# Patient Record
Sex: Female | Born: 1970 | Race: White | Hispanic: No | Marital: Married | Smoking: Never smoker
Health system: Southern US, Community
[De-identification: ages and names within clinical notes are randomized; demographics above are authoritative.]

## PROBLEM LIST (undated history)

## (undated) DIAGNOSIS — E1140 Type 2 diabetes mellitus with diabetic neuropathy, unspecified: Secondary | ICD-10-CM

## (undated) DIAGNOSIS — R51 Headache: Secondary | ICD-10-CM

## (undated) DIAGNOSIS — E119 Type 2 diabetes mellitus without complications: Secondary | ICD-10-CM

## (undated) DIAGNOSIS — N200 Calculus of kidney: Secondary | ICD-10-CM

## (undated) DIAGNOSIS — E785 Hyperlipidemia, unspecified: Secondary | ICD-10-CM

## (undated) DIAGNOSIS — I10 Essential (primary) hypertension: Secondary | ICD-10-CM

## (undated) DIAGNOSIS — G4700 Insomnia, unspecified: Secondary | ICD-10-CM

## (undated) DIAGNOSIS — IMO0002 Reserved for concepts with insufficient information to code with codable children: Secondary | ICD-10-CM

## (undated) DIAGNOSIS — D649 Anemia, unspecified: Secondary | ICD-10-CM

## (undated) HISTORY — DX: Anemia, unspecified: D64.9

## (undated) HISTORY — DX: Calculus of kidney: N20.0

## (undated) HISTORY — DX: Essential (primary) hypertension: I10

## (undated) HISTORY — DX: Reserved for concepts with insufficient information to code with codable children: IMO0002

## (undated) HISTORY — DX: Type 2 diabetes mellitus with diabetic neuropathy, unspecified: E11.40

## (undated) HISTORY — DX: Headache: R51

## (undated) HISTORY — DX: Insomnia, unspecified: G47.00

## (undated) HISTORY — PX: LAPAROSCOPY: SHX197

## (undated) HISTORY — DX: Type 2 diabetes mellitus without complications: E11.9

## (undated) HISTORY — DX: Hyperlipidemia, unspecified: E78.5

---
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
## 1998-08-07 ENCOUNTER — Inpatient Hospital Stay (HOSPITAL_COMMUNITY): Admission: AD | Admit: 1998-08-07 | Discharge: 1998-08-07 | Payer: Self-pay | Admitting: Obstetrics and Gynecology

## 1998-08-14 ENCOUNTER — Inpatient Hospital Stay (HOSPITAL_COMMUNITY): Admission: AD | Admit: 1998-08-14 | Discharge: 1998-08-17 | Payer: Self-pay | Admitting: Obstetrics and Gynecology

## 1998-08-18 ENCOUNTER — Inpatient Hospital Stay (HOSPITAL_COMMUNITY): Admission: AD | Admit: 1998-08-18 | Discharge: 1998-08-18 | Payer: Self-pay | Admitting: Obstetrics & Gynecology

## 1998-08-23 ENCOUNTER — Encounter (HOSPITAL_COMMUNITY): Admission: RE | Admit: 1998-08-23 | Discharge: 1998-11-21 | Payer: Self-pay | Admitting: Obstetrics and Gynecology

## 1999-10-09 ENCOUNTER — Inpatient Hospital Stay (HOSPITAL_COMMUNITY): Admission: AD | Admit: 1999-10-09 | Discharge: 1999-10-12 | Payer: Self-pay | Admitting: Obstetrics and Gynecology

## 2001-01-27 ENCOUNTER — Other Ambulatory Visit: Admission: RE | Admit: 2001-01-27 | Discharge: 2001-01-27 | Payer: Self-pay | Admitting: Obstetrics and Gynecology

## 2002-02-22 ENCOUNTER — Other Ambulatory Visit: Admission: RE | Admit: 2002-02-22 | Discharge: 2002-02-22 | Payer: Self-pay | Admitting: Obstetrics and Gynecology

## 2003-03-16 ENCOUNTER — Other Ambulatory Visit: Admission: RE | Admit: 2003-03-16 | Discharge: 2003-03-16 | Payer: Self-pay | Admitting: Obstetrics and Gynecology

## 2004-03-21 ENCOUNTER — Other Ambulatory Visit: Admission: RE | Admit: 2004-03-21 | Discharge: 2004-03-21 | Payer: Self-pay | Admitting: Obstetrics and Gynecology

## 2004-07-10 HISTORY — PX: OTHER SURGICAL HISTORY: SHX169

## 2005-06-29 ENCOUNTER — Ambulatory Visit (HOSPITAL_COMMUNITY): Admission: AD | Admit: 2005-06-29 | Discharge: 2005-06-29 | Payer: Self-pay | Admitting: Obstetrics & Gynecology

## 2005-06-29 ENCOUNTER — Encounter (INDEPENDENT_AMBULATORY_CARE_PROVIDER_SITE_OTHER): Payer: Self-pay | Admitting: Specialist

## 2005-07-25 ENCOUNTER — Other Ambulatory Visit: Admission: RE | Admit: 2005-07-25 | Discharge: 2005-07-25 | Payer: Self-pay | Admitting: Obstetrics and Gynecology

## 2006-09-11 ENCOUNTER — Ambulatory Visit (HOSPITAL_COMMUNITY): Admission: RE | Admit: 2006-09-11 | Discharge: 2006-09-11 | Payer: Self-pay | Admitting: Obstetrics and Gynecology

## 2006-09-11 ENCOUNTER — Encounter (INDEPENDENT_AMBULATORY_CARE_PROVIDER_SITE_OTHER): Payer: Self-pay | Admitting: Specialist

## 2009-01-29 ENCOUNTER — Emergency Department (HOSPITAL_COMMUNITY): Admission: EM | Admit: 2009-01-29 | Discharge: 2009-01-29 | Payer: Self-pay | Admitting: Emergency Medicine

## 2009-02-01 ENCOUNTER — Ambulatory Visit: Payer: Self-pay | Admitting: Diagnostic Radiology

## 2009-02-01 ENCOUNTER — Emergency Department (HOSPITAL_BASED_OUTPATIENT_CLINIC_OR_DEPARTMENT_OTHER): Admission: EM | Admit: 2009-02-01 | Discharge: 2009-02-01 | Payer: Self-pay | Admitting: Emergency Medicine

## 2009-02-03 ENCOUNTER — Emergency Department (HOSPITAL_BASED_OUTPATIENT_CLINIC_OR_DEPARTMENT_OTHER): Admission: EM | Admit: 2009-02-03 | Discharge: 2009-02-03 | Payer: Self-pay | Admitting: Emergency Medicine

## 2009-02-03 ENCOUNTER — Ambulatory Visit: Payer: Self-pay | Admitting: Interventional Radiology

## 2010-07-05 IMAGING — CR DG CHEST 2V
2 series · 2 of 2 positions shown · non-contrast
Comparison: None

CLINICAL DATA: Cough.  Chest pain.

CHEST - 2 VIEW

[w chest pa]
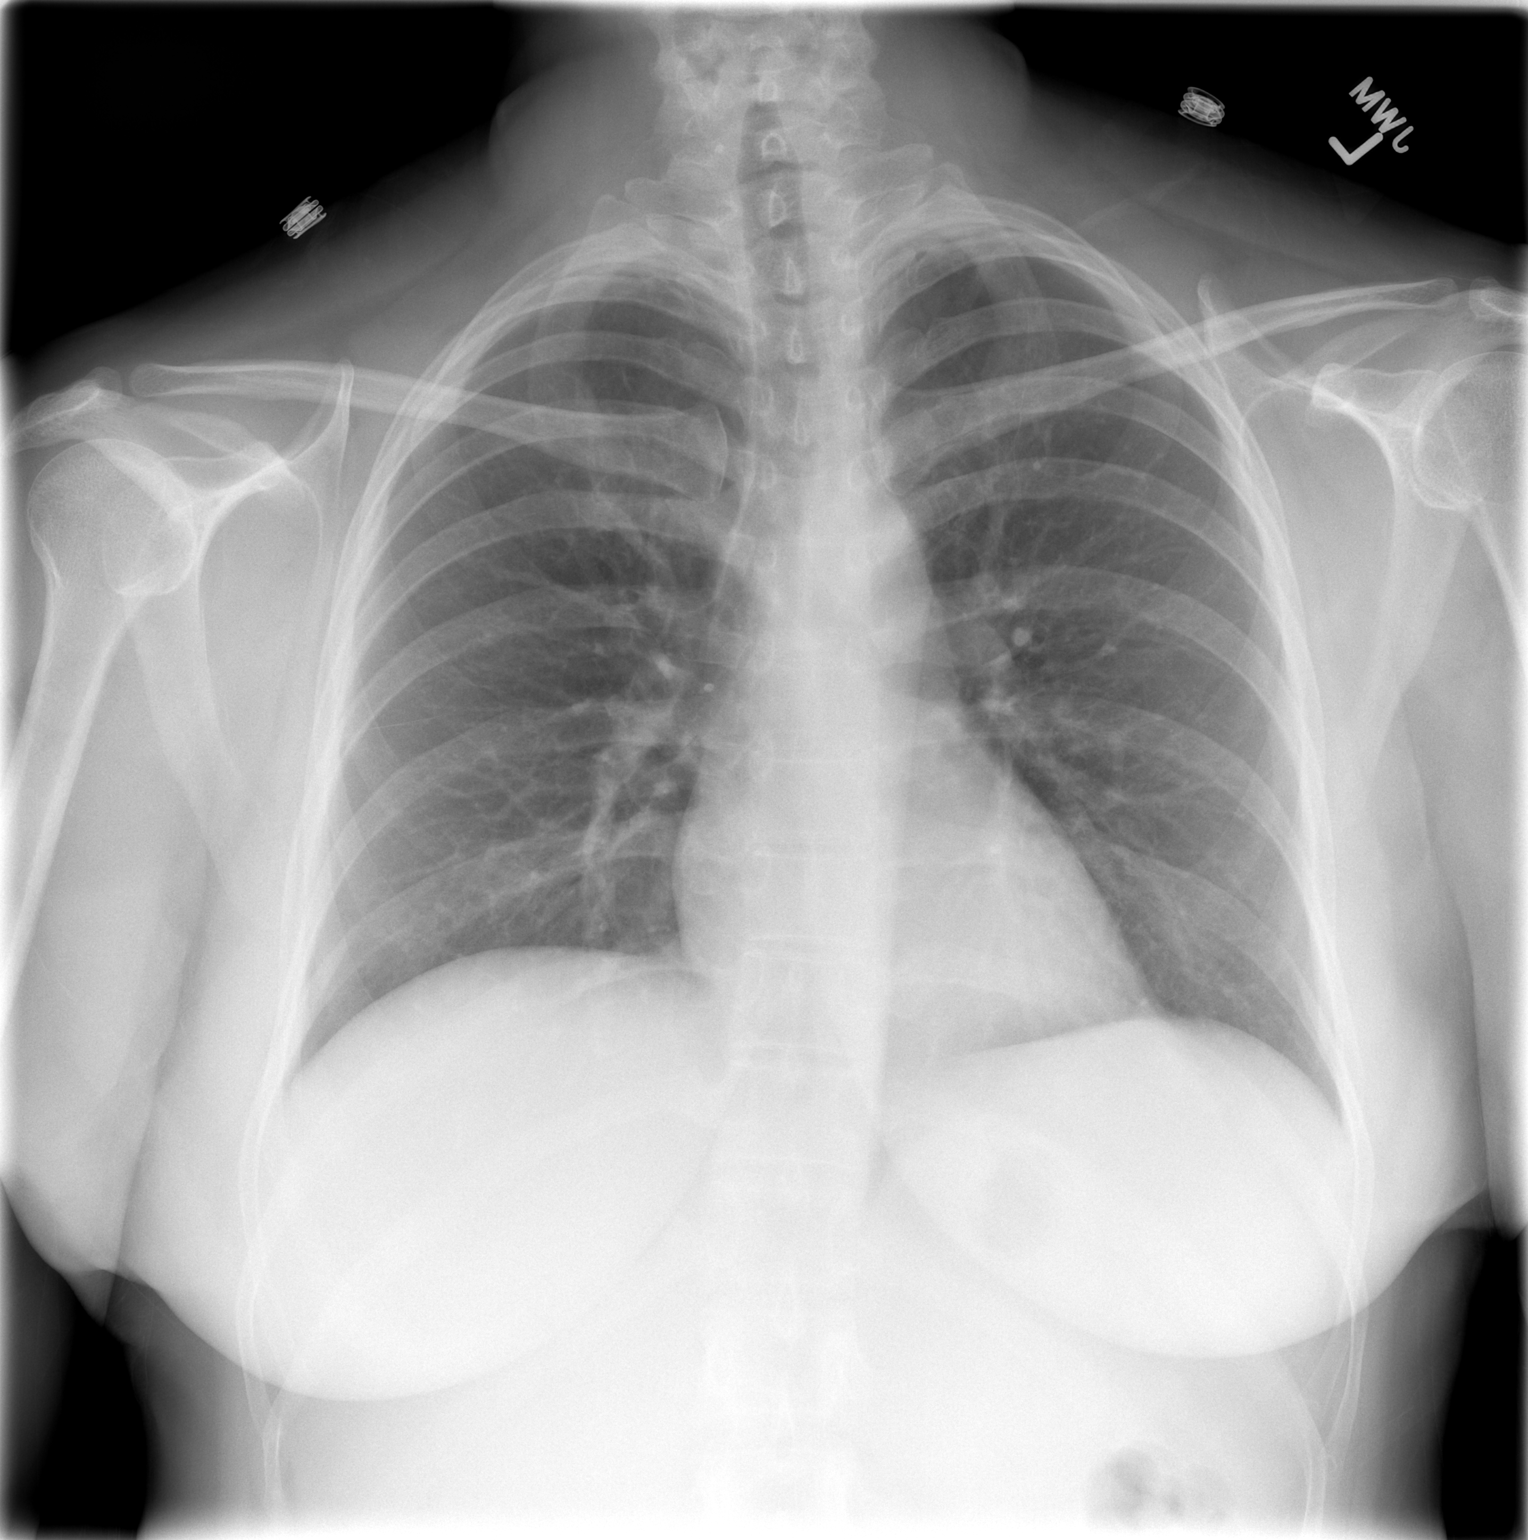

[w chest lat]
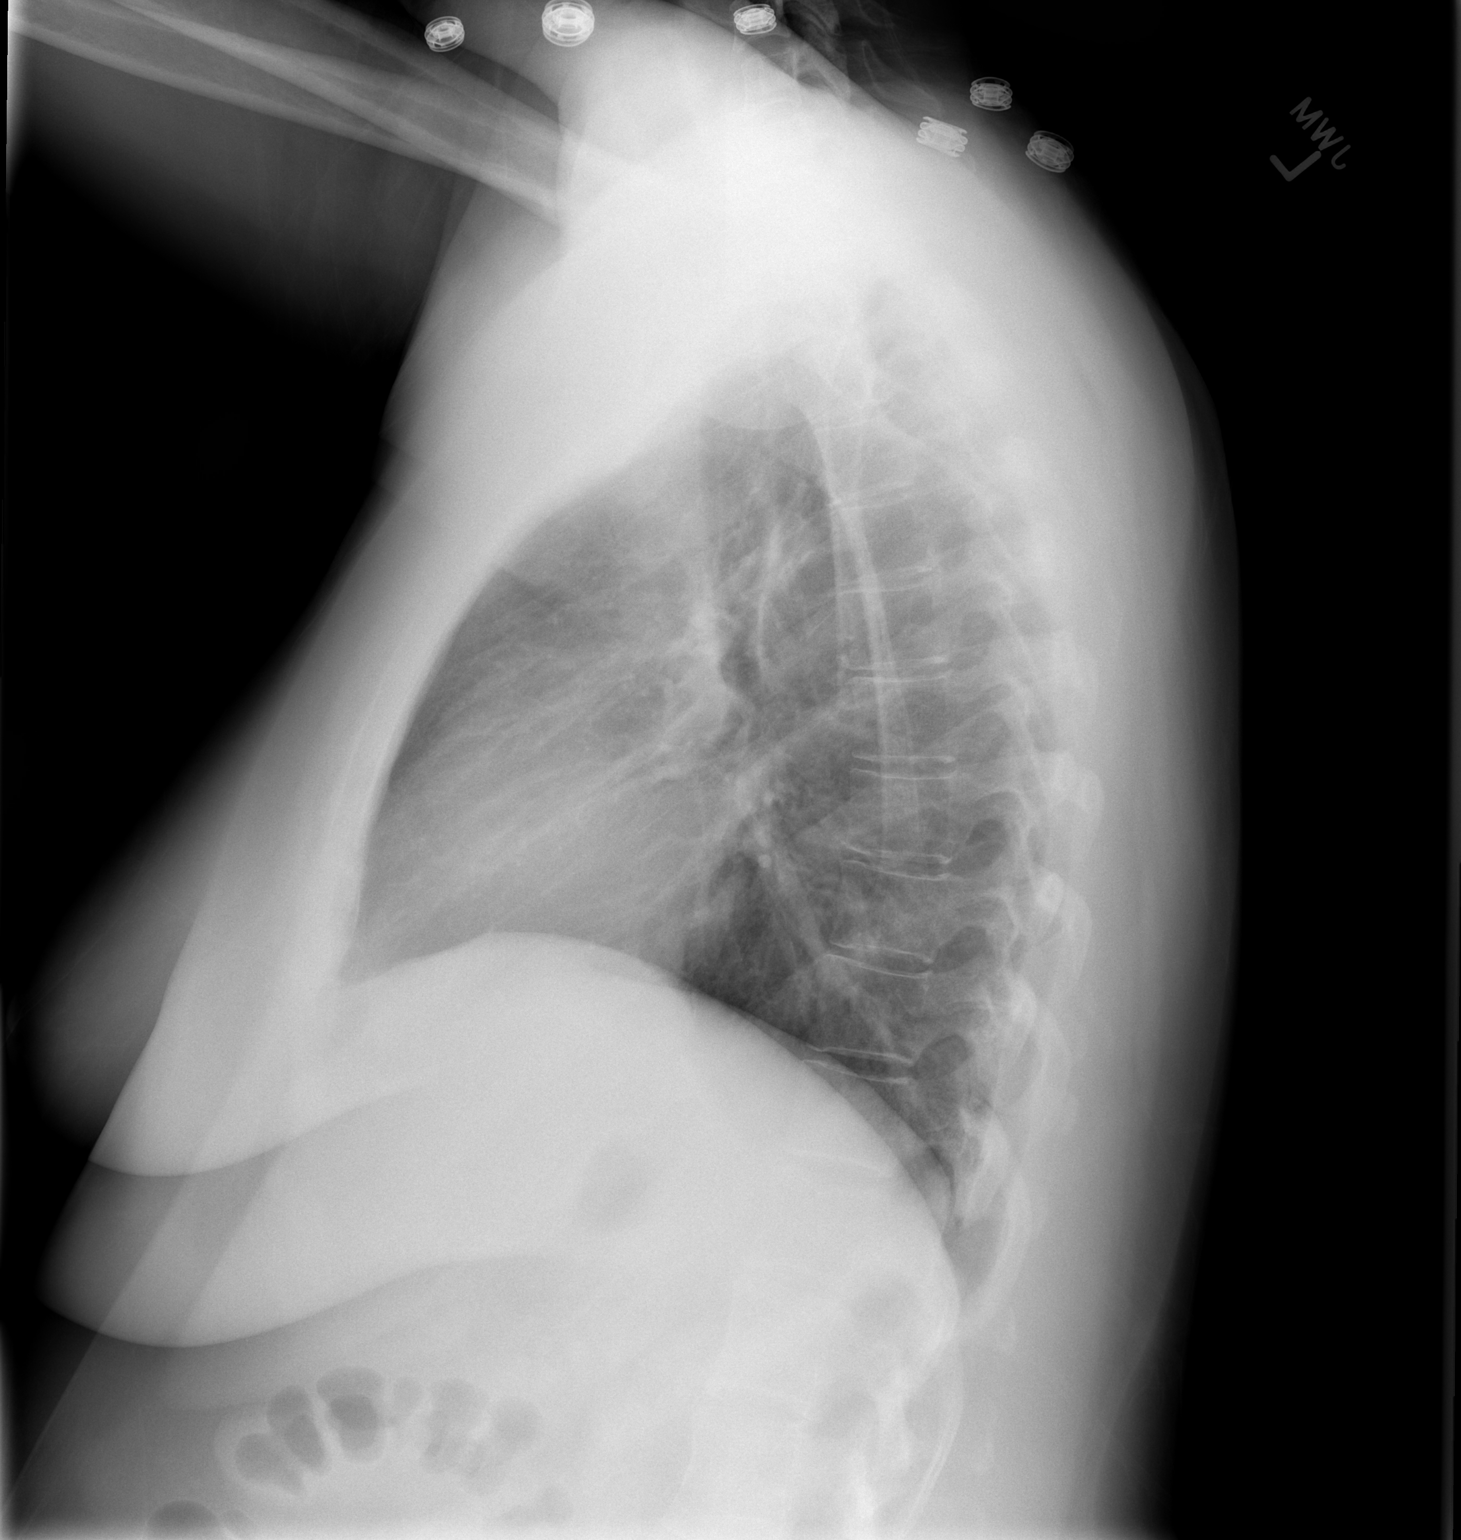

[2 of 2 positions shown; findings below may reference images not displayed]

FINDINGS: Heart size and mediastinal contours are normal.  Both
lungs are clear.  There is no evidence of pleural effusion.  No
mass or adenopathy identified.
IMPRESSION: No active cardiopulmonary disease.

## 2010-09-03 ENCOUNTER — Other Ambulatory Visit: Payer: Self-pay | Admitting: Obstetrics and Gynecology

## 2010-11-18 LAB — URINE CULTURE
Colony Count: NO GROWTH
Culture: NO GROWTH

## 2010-11-18 LAB — URINALYSIS, ROUTINE W REFLEX MICROSCOPIC
Bilirubin Urine: NEGATIVE
Glucose, UA: 100 mg/dL — AB
Glucose, UA: 100 mg/dL — AB
Ketones, ur: NEGATIVE mg/dL
Leukocytes, UA: NEGATIVE
Nitrite: NEGATIVE
Nitrite: POSITIVE — AB
Protein, ur: NEGATIVE mg/dL
Protein, ur: NEGATIVE mg/dL
Protein, ur: 100 mg/dL — AB
Urobilinogen, UA: 0.2 mg/dL (ref 0.0–1.0)
Urobilinogen, UA: 0.2 mg/dL (ref 0.0–1.0)
pH: 5.5 (ref 5.0–8.0)

## 2010-11-18 LAB — CBC
HCT: 38.2 % (ref 36.0–46.0)
MCV: 89.6 fL (ref 78.0–100.0)
Platelets: 209 10*3/uL (ref 150–400)
Platelets: 220 10*3/uL (ref 150–400)
WBC: 7.5 10*3/uL (ref 4.0–10.5)
WBC: 8.9 10*3/uL (ref 4.0–10.5)

## 2010-11-18 LAB — URINE MICROSCOPIC-ADD ON

## 2010-11-18 LAB — DIFFERENTIAL
Basophils Absolute: 0 10*3/uL (ref 0.0–0.1)
Basophils Absolute: 0 10*3/uL (ref 0.0–0.1)
Basophils Relative: 1 % (ref 0–1)
Eosinophils Relative: 2 % (ref 0–5)
Lymphocytes Relative: 15 % (ref 12–46)
Lymphocytes Relative: 35 % (ref 12–46)
Monocytes Absolute: 0.4 10*3/uL (ref 0.1–1.0)
Monocytes Absolute: 0.5 10*3/uL (ref 0.1–1.0)
Monocytes Relative: 6 % (ref 3–12)
Neutro Abs: 3.9 10*3/uL (ref 1.7–7.7)
Neutrophils Relative %: 51 % (ref 43–77)

## 2010-11-18 LAB — COMPREHENSIVE METABOLIC PANEL
AST: 33 U/L (ref 0–37)
Albumin: 4.5 g/dL (ref 3.5–5.2)
Albumin: 5 g/dL (ref 3.5–5.2)
Alkaline Phosphatase: 165 U/L — ABNORMAL HIGH (ref 39–117)
BUN: 8 mg/dL (ref 6–23)
Chloride: 101 mEq/L (ref 96–112)
Chloride: 103 mEq/L (ref 96–112)
Creatinine, Ser: 0.6 mg/dL (ref 0.4–1.2)
GFR calc Af Amer: >60 mL/min (ref >60)
GFR calc non Af Amer: >60 mL/min (ref >60)
Potassium: 4.5 mEq/L (ref 3.5–5.1)
Sodium: 143 mEq/L (ref 135–145)
Total Bilirubin: 0.2 mg/dL — ABNORMAL LOW (ref 0.3–1.2)
Total Bilirubin: 0.3 mg/dL (ref 0.3–1.2)
Total Protein: 8.5 g/dL — ABNORMAL HIGH (ref 6.0–8.3)

## 2010-11-18 LAB — LIPASE, BLOOD: Lipase: 88 U/L (ref 23–300)

## 2010-11-18 LAB — GLUCOSE, CAPILLARY: Glucose-Capillary: 238 mg/dL — ABNORMAL HIGH (ref 70–99)

## 2010-12-27 NOTE — Op Note (Signed)
NAMEJUPITER, BOYS NO.:  [id number]   MEDICAL RECORD NO.:  [id number]          PATIENT TYPE:  AMB   LOCATION:  SDC                           FACILITY:  [facility]   PHYSICIAN:  Ilda Mori, M.D.   DATE OF BIRTH:  1970/09/21   DATE OF PROCEDURE:  06/29/2005  DATE OF DISCHARGE:                                 OPERATIVE REPORT   PREOPERATIVE DIAGNOSES:  1.  Menorrhagia.  2.  Anemia.   POSTOPERATIVE DIAGNOSES:  1.  Menorrhagia.  2.  Anemia.   OPERATION/PROCEDURE:  Dilatation and curettage.   SURGEON:  Richard D. Arlyce Dice, M.D.   ANESTHESIA:  MAC and paracervical block.   SPECIMENS:  Endometrial curettings.   FINDINGS:  Moderate amounts of endometrial tissue.   ESTIMATED BLOOD LOSS:  Minimal.   COMPLICATIONS:  None.   INDICATIONS:  This is a 40 year old, gravida 2, para 2 female who has been  having approximately year of irregular vaginal bleeding.  During the past  four weeks, the bleeding became markedly heavier.  Attempt was made to  control the bleeding with oral contraceptives but this method failed and the  patient complained of very heavy bleeding over the last 48 hours.  On  admission to MAU, she found to have a mild tachycardia, a pulse of  approximately 98 with a marked increase in pulse rate when she was standing  to 120.  Her hemoglobin was 10.9.  Based upon the history of moderate to  severe persistent vaginal bleeding and the physical findings in the MAU,  decision was made to proceed with D&C.   DESCRIPTION OF PROCEDURE:  The patient was taken to the operating room and a  IV sedation was administered.  She was placed in the dorsal lithotomy  position and the vagina and perineum were prepped and draped in the sterile  fashion and 20 mL of 1% lidocaine was placed in the paracervical tissue.  The cervix was dilated with Shawnie Pons dilators to a 19-French.  A Heaney  serrated curet was introduced into the endometrial cavity which sounded to  10 cm  and a sharp curettage was performed with moderate amount of issue  obtained.  A polyp forceps was introduced and the cavity was carefully  evaluated to see if polyps were present and no polyps were found.  This  procedure was then repeated both with the sharp curet and with the polyp  forceps until it was felt that there was no significant amount of tissure  remaining on the endometrium.  The patient tolerated the procedure well and  left the operating room in good condition.      Ilda Mori, M.D.  Electronically Signed     RK/MEDQ  D:  06/29/2005  T:  06/30/2005  Job:  [id number]

## 2010-12-27 NOTE — Op Note (Signed)
Bridget [name], [name] NO.:  [id number]   MEDICAL RECORD NO.:  [id number]          PATIENT TYPE:  AMB   LOCATION:  SDC                           FACILITY:  [facility]   PHYSICIAN:  Miguel Aschoff, M.D.       DATE OF BIRTH:  1970/08/27   DATE OF PROCEDURE:  09/11/2006  DATE OF DISCHARGE:                               OPERATIVE REPORT   PREOPERATIVE DIAGNOSIS:  Menometrorrhagia.   POSTOPERATIVE DIAGNOSIS:  Menometrorrhagia with endometrial polyp.   PROCEDURE:  1. Cervical dilatation.  2. Hysteroscopy.  3. Uterine curettage.   SURGEON:  Miguel Aschoff, MD   ANESTHESIA:  General.   COMPLICATIONS:  None.   JUSTIFICATION:  The patient is a 40 year old white female with history  of persistent irregular heavy vaginal bleeding unresponsive to medical  therapy including oral contraceptives.  She presents now to undergo  hysteroscopy and D&C to see if an etiology of the bleeding can be  established and corrected.  The risks and benefits of the procedure were  discussed with the patient.   PROCEDURE:  The patient was taken to the operating room and placed in a  supine position.  General anesthesia was administered without  difficulty.  She was then placed in the dorsal lithotomy position and  prepped and draped in the usual sterile fashion.  Examination under  anesthesia revealed normal external genitalia, normal Bartholin and  Skene's glands and normal urethra.  The vaginal vault was without gross  lesion.  The cervix was without gross lesion.  The uterus was noted be  anterior, of normal size and shape.  The adnexa revealed no masses.  At this point, a speculum was placed in  the vaginal vault and the anterior cervical lip was grasped with a  tenaculum and then dilated using serial Pratt dilators until a #25  dilator could be passed.  Once this was done, the diagnostic  hysteroscope was advanced through the cervix under direct visualization.  No endocervical lesions were  noted.  The only remarkable finding in the  endometrium was a small polyp on the left lateral vaginal wall; no other  abnormalities were noted.  No submucous myomas were noted.  At this  point, the hysteroscope was removed and sharp vigorous curettage was  carried out in an effort to remove the endometrium and arrest of  bleeding.  This tissue was sent for histologic study.  Once this was  completed, all instruments were removed.  The cervix was then injected  with 15 mL of 1% Xylocaine by placing an equal amounts in the 12, 4 and  8 o'clock positions.  The tenaculum was removed.  There was a small area  of bleeding was noted where ever the tenaculum had grasped the cervix  and this was brought under control by a figure-of-eight suture of 2-0  chromic.  The estimated blood loss was approximately 20 mL.  The patient  tolerated the procedure well and went to the recovery room in  satisfactory condition.  Due to the patient's persistent irregular  bleeding without an obvious cause on hysteroscopy, clotting  studies will  be determined to ensure that there is no clotting factor that is  involved with the menometrorrhagia.   The patient will be seen back in 4 weeks for followup examination.  She  is to call if there are any problems such as fever, pain or heavy  bleeding.   MEDICATIONS FOR HOME:  1. Darvocet-N 100 one every 4 hours as needed for pain.  2. Doxycycline 100 mg twice a day x3 days.  3. Ambien 10 mg p.o. h.s. p.r.n. for sleep.   DISCHARGE INSTRUCTIONS:  She is to call for pathology report on February  5.      Miguel Aschoff, M.D.  Electronically Signed     AR/MEDQ  D:  09/11/2006  T:  09/11/2006  Job:  [id number]

## 2013-04-26 ENCOUNTER — Encounter: Payer: Self-pay | Admitting: Neurology

## 2013-04-27 ENCOUNTER — Encounter: Payer: Self-pay | Admitting: Neurology

## 2013-04-27 ENCOUNTER — Ambulatory Visit: Payer: BC Managed Care – HMO | Admitting: Neurology

## 2013-04-27 ENCOUNTER — Ambulatory Visit (INDEPENDENT_AMBULATORY_CARE_PROVIDER_SITE_OTHER): Payer: BC Managed Care – HMO | Admitting: Neurology

## 2013-04-27 VITALS — BP 122/87 | HR 91 | Resp 16 | Ht 65.0 in | Wt 166.0 lb

## 2013-04-27 DIAGNOSIS — R519 Headache, unspecified: Secondary | ICD-10-CM

## 2013-04-27 DIAGNOSIS — Z872 Personal history of diseases of the skin and subcutaneous tissue: Secondary | ICD-10-CM

## 2013-04-27 DIAGNOSIS — IMO0002 Reserved for concepts with insufficient information to code with codable children: Secondary | ICD-10-CM

## 2013-04-27 DIAGNOSIS — E1142 Type 2 diabetes mellitus with diabetic polyneuropathy: Secondary | ICD-10-CM

## 2013-04-27 DIAGNOSIS — G44209 Tension-type headache, unspecified, not intractable: Secondary | ICD-10-CM

## 2013-04-27 DIAGNOSIS — E1140 Type 2 diabetes mellitus with diabetic neuropathy, unspecified: Secondary | ICD-10-CM

## 2013-04-27 DIAGNOSIS — R51 Headache: Secondary | ICD-10-CM

## 2013-04-27 DIAGNOSIS — F19982 Other psychoactive substance use, unspecified with psychoactive substance-induced sleep disorder: Secondary | ICD-10-CM

## 2013-04-27 DIAGNOSIS — G4700 Insomnia, unspecified: Secondary | ICD-10-CM

## 2013-04-27 DIAGNOSIS — E1149 Type 2 diabetes mellitus with other diabetic neurological complication: Secondary | ICD-10-CM

## 2013-04-27 HISTORY — DX: Headache, unspecified: R51.9

## 2013-04-27 HISTORY — DX: Reserved for concepts with insufficient information to code with codable children: IMO0002

## 2013-04-27 HISTORY — DX: Type 2 diabetes mellitus with diabetic neuropathy, unspecified: E11.40

## 2013-04-27 MED ORDER — VALPROATE SODIUM 500 MG/5ML IV SOLN: 500.0000 mg | Freq: Once | INTRAVENOUS | Status: DC

## 2013-04-27 MED ORDER — IMIPRAMINE HCL 50 MG PO TABS: 50.0000 mg | ORAL_TABLET | Freq: Every day | ORAL | Status: DC

## 2013-04-27 MED ORDER — VALPROATE SODIUM 500 MG/5ML IV SOLN
500.0000 mg | INTRAVENOUS | Status: AC
  Administered 2013-04-27: 500 mg via INTRAVENOUS

## 2013-04-27 MED ORDER — PREDNISONE 5 MG PO TABS: 5.0000 mg | ORAL_TABLET | Freq: Every day | ORAL | Status: DC

## 2013-04-27 NOTE — Patient Instructions (Signed)
Tension Headache A tension headache is a feeling of pain, pressure, or aching often felt over the front and sides of the head. The pain can be dull or can feel tight (constricting). It is the most common type of headache. Tension headaches are not normally associated with nausea or vomiting and do not get worse with physical activity. Tension headaches can last 30 minutes to several days.  CAUSES  The exact cause is not known, but it may be caused by chemicals and hormones in the brain that lead to pain. Tension headaches often begin after stress, anxiety, or depression. Other triggers may include:  Alcohol.  Caffeine (too much or withdrawal).  Respiratory infections (colds, flu, sinus infections).  Dental problems or teeth clenching.  Fatigue.  Holding your head and neck in one position too long while using a computer. SYMPTOMS   Pressure around the head.   Dull, aching head pain.   Pain felt over the front and sides of the head.   Tenderness in the muscles of the head, neck, and shoulders. DIAGNOSIS  A tension headache is often diagnosed based on:   Symptoms.   Physical examination.   A CT scan or MRI of your head. These tests may be ordered if symptoms are severe or unusual. TREATMENT  Medicines may be given to help relieve symptoms.  HOME CARE INSTRUCTIONS   Only take over-the-counter or prescription medicines for pain or discomfort as directed by your caregiver.   Lie down in a dark, quiet room when you have a headache.   Keep a journal to find out what may be triggering your headaches. For example, write down:  What you eat and drink.  How much sleep you get.  Any change to your diet or medicines.  Try massage or other relaxation techniques.   Ice packs or heat applied to the head and neck can be used. Use these 3 to 4 times per day for 15 to 20 minutes each time, or as needed.   Limit stress.   Sit up straight, and do not tense your muscles.    Quit smoking if you smoke.  Limit alcohol use.  Decrease the amount of caffeine you drink, or stop drinking caffeine.  Eat and exercise regularly.  Get 7 to 9 hours of sleep, or as recommended by your caregiver.  Avoid excessive use of pain medicine as recurrent headaches can occur.  SEEK MEDICAL CARE IF:   You have problems with the medicines you were prescribed.  Your medicines do not work.  You have a change from the usual headache.  You have nausea or vomiting. SEEK IMMEDIATE MEDICAL CARE IF:   Your headache becomes severe.  You have a fever.  You have a stiff neck.  You have loss of vision.  You have muscular weakness or loss of muscle control.  You lose your balance or have trouble walking.  You feel faint or pass out.  You have severe symptoms that are different from your first symptoms. MAKE SURE YOU:   Understand these instructions.  Will watch your condition.  Will get help right away if you are not doing well or get worse. Document Released: 07/28/2005 Document Revised: 10/20/2011 Document Reviewed: 07/18/2011 [facility] Patient Information 2014 [place], [place]. Headaches, Analgesic Rebound Analgesic agents are prescription or over-the-counter medications used to control pain, including headaches. However, overuse or misuse of theses medications can lead to rebound headaches. Rebound headaches are headaches that recur after the analgesic medication wears off. Eventually, the  rebound headaches can become longlasting (chronic). If this happens, you must completely stop using analgesic medications. If not, the chronic headache is likely to continue despite the use of any other treatment. Usually when you stop taking analgesic medications, the headache may initally get worse for several days. Along with this you may experience sickness in your stomach (nausea), and you may throw up (vomit). After a period of 3 to 5 days, these symptoms begin to improve.  Sometimes improvement may take longer. Eventually, the headaches will slowly improve with treatment with the right medications. Most people are able to stop using analgesic medications at home with a caregiver's supervision. But some find it difficult and may require hospitalization. Document Released: 10/18/2003 Document Revised: 10/20/2011 Document Reviewed: 03/16/2008 [facility] Patient Information 2014 [place], [place].

## 2013-04-27 NOTE — Progress Notes (Signed)
Patient here seeing Dr. Vickey Huger.  Order for Depacon 500mg  IV.  Patient to treatment room.  Patient describes headache pain level 3-4.  IV started in left AC good blood return, 22g angiocath.  Depacon 500mg /173ml started at 1145.  Upon completion pain level down to 2.  IV discontinued and removed, site covered with guaze, patient to checkout in NAD.

## 2013-04-27 NOTE — Patient Instructions (Signed)
Patient to go home and rest.

## 2013-04-27 NOTE — Progress Notes (Addendum)
[facility]  Provider:  Melvyn Novas, M D  Referring Provider: Andreas Blower., MD Primary Care Physician:  Dennis Bast, MD  Chief Complaint  Patient presents with  . New Evaluation    migraine headaches, Luiz [name], rm 10    HPI:  Bridget [name] is a 42 y.o. female  Is seen here as a referral/ revisit  from Dr. Luiz [name] for migraine evaluation and treatment . She wants to be looked at for foot pain as well.  She has seen 3 other neurologists before.    [name] reports that a history of headaches since young adulthood. She describes her headaches as bitemporal bifrontal. They sometimes throbbing, pulsatile. At times they have felt as a ice pick or  hammer would hit her- Headaches in to be worse when she wakes up in the morning. She can wake up frequently at night and notices her headaches but it seems that the headaches don't necessarily wake her. At times she will have blurred vision with her headaches but she has not noticed a visual field stricture. There is no light perception or flickering lights. This is not the classic visual aura of migraines. If the headaches reach a level above 8/10 and she may feel nauseated, she never had to vomit. Heat and humidity , strong odors may trigger a headache. A headache will typically last all day. Phonophobia is not present,  but photophobia.   NSAIDS will give her a relief of 2-3 hours, than the headache rebounds. She has 7 days a week the same headaches , and takes daily NSAIDS.   She cut caffeine, chocolate and cheeses from her diet. She failed to see any menstrual triggers.  Classic imitrex, and frova, tramadol and Esgic all failed.  Prevention with Depakote failed, and propranolol, Topamax as well.      She developed gestational diabetes with her first pregnancy at age 69, but was not moderately obese at the time. Her second pregnancy went by without gestational diabetes, 14 month later.  She was diagnosed finally with  diabetes at age 59. At the time she states she was slim but she is now. Over the last 4 years she has developed dysesthesia is at times painful burning in both feet, presumed to be a diabetic neuropathy.  She reports poor sleep, non restorative, Has been told that she snores when supine , but has not been told about any apneas.  No nocturia. Wakes up frequently , with a severe headache that doesn't let up as the day goes on.    Review of Systems: Out of a complete 14 system review, the patient complains of only the following symptoms, and all other reviewed systems are negative. Daily headaches , rebound headaches. Burning feet - pin and needles.   History   Social History  . Marital Status: Single    Spouse Name: [name]    Number of Children: 2  . Years of Education: 16   Occupational History  .      not employed   Social History Main Topics  . Smoking status: Never Smoker   . Smokeless tobacco: Never Used  . Alcohol Use: No  . Drug Use: No  . Sexual Activity: Not on file   Other Topics Concern  . Not on file   Social History Narrative  . No narrative on file    Family History  Problem Relation Age of Onset  . Alzheimer's disease Paternal Grandmother     Past Medical History  Diagnosis Date  . Diabetes mellitus without complication   . Anemia   . Hyperlipidemia   . Hypertension   . Insomnia   . Nephrolithiasis     Past Surgical History  Procedure Laterality Date  . Mouth surgery  1987    wisdom teeth  . Cesarean section  2000  . Laparoscopy      therapeutic  . Endometreosis  07/10/2004    Current Outpatient Prescriptions  Medication Sig Dispense Refill  . aspirin 81 MG tablet Take 81 mg by mouth daily.      . butalbital-acetaminophen-caffeine (FIORICET, ESGIC) 50-325-40 MG per tablet       . Canagliflozin (INVOKANA) 100 MG TABS Take by mouth daily.      Marland Kitchen glimepiride (AMARYL) 4 MG tablet Take 4 mg by mouth 2 (two) times daily.      Marland Kitchen glucose blood test  strip 1 each by Other route as needed for other. Ultra blue strip,Use as instructed      . HYDROcodone-acetaminophen (NORCO/VICODIN) 5-325 MG per tablet Take 1 tablet by mouth every 4 (four) hours. Take 1-2 tablets  As needed      . insulin detemir (LEVEMIR) 100 UNIT/ML injection Inject into the skin at bedtime. As needed      . LOSARTAN POTASSIUM PO Take 100 mg by mouth daily.      . metFORMIN (GLUCOPHAGE) 500 MG tablet Take 500 mg by mouth 2 (two) times daily with a meal.      . norgestimate-ethinyl estradiol (SPRINTEC 28) 0.25-35 MG-MCG tablet Take 1 tablet by mouth daily.      Marland Kitchen PROMETHAZINE HCL PO Take 25 mg by mouth. One tablet every 4-6 hours as needed      . zolpidem (AMBIEN) 10 MG tablet Take 10 mg by mouth at bedtime as needed for sleep.       No current facility-administered medications for this visit.    Allergies as of 04/27/2013 - Review Complete 04/27/2013  Allergen Reaction Noted  . Ibuprofen  04/26/2013  . Naprosyn [naproxen]  04/26/2013  . Simvastatin  04/26/2013    Vitals: BP 122/87  Pulse 91  Resp 16  Ht 5\' 5"  (1.651 m)  Wt 166 lb (75.297 kg)  BMI 27.62 kg/m2 Last Weight:  Wt Readings from Last 1 Encounters:  04/27/13 166 lb (75.297 kg)   Last Height:   Ht Readings from Last 1 Encounters:  04/27/13 5\' 5"  (1.651 m)    Physical exam:  General: The patient is awake, alert and appears not in acute distress. The patient is well groomed. Head: Normocephalic, atraumatic. Neck is supple. Mallampati 3 , neck circumference: 15 inches ,  Cardiovascular:  Regular rate and rhythm , without  murmurs or carotid bruit, and without distended neck veins. Respiratory: Lungs are clear to auscultation. Skin:  Without evidence of edema, or rash Trunk: BMI is elevated and patient  has normal posture.  Neurologic exam : The patient is awake and alert, oriented to place and time.  Memory subjective  described as intact. There is a normal attention span & concentration ability.   Speech is fluent without  dysarthria, dysphonia or aphasia.  Mood and affect are appropriate.  Cranial nerves: Pupils are equal and briskly reactive to light. Funduscopic exam without  evidence of pallor or edema. Extraocular movements  in vertical and horizontal planes intact and without nystagmus. Visual fields by finger perimetry are intact. Hearing to finger rub intact.  Facial sensation intact to fine touch. Facial motor  strength is symmetric and tongue and uvula move midline.  Motor exam:   Normal tone and normal muscle bulk and symmetric normal strength in all extremities.  Sensory:  Fine touch, pinprick and vibration were tested in all extremities. Her feet are less responsive to filament touch and vibration.  Proprioception is tested and  Normal. She wakes frequently with hand and finger numbness , "falling asleep". Coordination: Rapid alternating movements in the fingers/hands is tested and normal.  Finger-to-nose maneuver tested and normal without evidence of ataxia, dysmetria or tremor. Gait and station: Patient walks without assistive device , unassisted able to  climb up to the exam table. Strength within normal limits. Stance is stable and normal. Steps are unfragmented. Romberg testing is normal. Deep tendon reflexes: in the  upper and lower extremities are symmetric and intact. Babinski maneuver response is  downgoing.  Assessment:  After physical and neurologic examination, review of laboratory studies, imaging, neurophysiology testing and pre-existing records.  Unclear if these headaches started of as migraines or were always tension type. She felt improvement on prednisone, but cannot take this medication due to DM.  Inflammatory origin ?    Extensive discussion and information ,  Likely rebound headaches from continued overuse of NSAIDS , and und unfortunately  Resorted to narcotic pain medication, which will not be refilled from here.  I used the Dx  Of mixed headaches -  the patient will need to stop taking NSAIDs for at least 10 days to break this cycle, and is fearful to implement this advise.     Neuropathy of diabetic origin. Can lead to pain, RLS and worsening of insomnia.  I again refrained from narcotic pain medications.   Apparently non organic, chronic  insomnia.   This visit took 50 minutes.      Plan:  Treatment plan and additional workup : CRP, sed rate, ANA,  . EMG and NCS for upper extremities , since she has shoulder and neck pain, wakes up with her hands feeling asleep - cervical tension, DDD or carpal tunnel? . Try tricyclica for insomnia and tension pain.   I wrote for Imipramine.  (She failed Elavil). Please note , that non organic insomnia, such as seen in depression, anxiety or  substance abuse will not be treated in a neurological sleep clinic, and may require referral to a psychologist and counselor. I hope that the tricyclic will help with neuropathic pain and dyseasthesias.  Reviewed MRI and CT reports- no pathology noted.

## 2013-04-28 ENCOUNTER — Telehealth: Payer: Self-pay | Admitting: Neurology

## 2013-04-28 LAB — C-REACTIVE PROTEIN: CRP: 3.8 mg/L (ref 0.0–4.9)

## 2013-04-28 LAB — ANGIOTENSIN CONVERTING ENZYME
Angio Convert Enzyme: 22 U/L (ref 14–82)
Angio Convert Enzyme: 25 U/L (ref 14–82)

## 2013-04-28 LAB — SEDIMENTATION RATE: Sed Rate: 2 mm/hr (ref 0–32)

## 2013-04-28 LAB — ANA W/REFLEX IF POSITIVE: Anti Nuclear Antibody(ANA): NEGATIVE

## 2013-04-28 NOTE — Telephone Encounter (Signed)
I recommend METANX , a vit b12 derivate for neuropathy and several patients felt it benefited headaches.  The supplement  Special for headache is Butterburr , an herbal product.

## 2013-04-28 NOTE — Telephone Encounter (Signed)
Patient left message that doctor recommended she take a certain vitamin but she cannot remember the name.  She is asking which vitamin the doctor wanted her to take.  Patient was here as new patient on 04-27-13 for an office visit for migraines also foot pain.  Please advise and I will call patient.

## 2013-04-29 NOTE — Telephone Encounter (Signed)
Spoke to patient and relayed names of supplements.  She also said she took one dose last night of the Imipramine and it gave her a headache and made her nauseous.  She would like to know what to do, should she stop taking medicine.  [phone]

## 2013-05-02 NOTE — Telephone Encounter (Signed)
Left VM , patient may have withdrawal headaches from stopping the NSAIDS, which (likely) gave her rebound headaches. Seng Fouts, MD

## 2013-05-06 ENCOUNTER — Telehealth: Payer: Self-pay | Admitting: *Deleted

## 2013-05-06 NOTE — Telephone Encounter (Signed)
Spoke to patient. HA is a 3 on a pain scale, but is constant. Tylenol did not alleviate. Patient says she has had a HA everyday since seeing Dr. Vickey Huger on 04/27/13. She is requesting advice/ med for relief. Next OV is 06/15/13.

## 2013-05-06 NOTE — Telephone Encounter (Signed)
I am currently having headaches. They have gotten better, but wondering if the Dr. Vickey Huger can call something in today it would be greatly appreciated. I know she wanted me off of the pain killer for a while but the headache is bad today. Please call and advise.

## 2013-05-15 ENCOUNTER — Other Ambulatory Visit: Payer: Self-pay

## 2013-05-15 MED ORDER — L-METHYLFOLATE-B6-B12 3-35-2 MG PO TABS: 1.0000 | ORAL_TABLET | Freq: Every day | ORAL | Status: DC

## 2013-05-15 NOTE — Telephone Encounter (Signed)
Per phone note on 04/28/2013

## 2013-05-19 NOTE — Progress Notes (Signed)
Quick Note:  I called and LMVM for pt lab results normal. Call back as needed. Results mailed. ______

## 2013-05-25 ENCOUNTER — Telehealth: Payer: Self-pay | Admitting: Neurology

## 2013-05-27 ENCOUNTER — Telehealth: Payer: Self-pay | Admitting: Neurology

## 2013-05-27 DIAGNOSIS — G44209 Tension-type headache, unspecified, not intractable: Secondary | ICD-10-CM

## 2013-05-27 DIAGNOSIS — Z872 Personal history of diseases of the skin and subcutaneous tissue: Secondary | ICD-10-CM

## 2013-05-27 DIAGNOSIS — R519 Headache, unspecified: Secondary | ICD-10-CM

## 2013-05-27 DIAGNOSIS — G4700 Insomnia, unspecified: Secondary | ICD-10-CM

## 2013-05-27 NOTE — Telephone Encounter (Signed)
I called and spoke with the patient.  She said Prednisone helped some, but did not completley take her HA away.  Says she would like something else prescribed.  She says she will take prednisone and a new Rx for different med if MD approves.  Please advise.  Thank you.

## 2013-05-27 NOTE — Telephone Encounter (Signed)
No further pain medications. Patient has been on butalbutal and hydrocodone from other sources.  No narcotics from [place].  Imipramine and depakote were started here , and prednisone helped some. I recommend  Bio feed back for headaches , tension and insomnia.  Consider psychological evaluation.

## 2013-05-31 NOTE — Telephone Encounter (Signed)
I called patient and shared the information from Dr. Vickey Huger. I explained to her that we can refer her for a neuro-psyc evaluation and they may be able to better get to the bottom of what is causing her headaches. Patient stated she would like this referral.

## 2013-06-01 ENCOUNTER — Other Ambulatory Visit: Payer: Self-pay | Admitting: *Deleted

## 2013-06-01 DIAGNOSIS — G4700 Insomnia, unspecified: Secondary | ICD-10-CM

## 2013-06-01 DIAGNOSIS — R51 Headache: Secondary | ICD-10-CM

## 2013-06-02 ENCOUNTER — Other Ambulatory Visit: Payer: Self-pay | Admitting: Neurology

## 2013-06-02 DIAGNOSIS — F5105 Insomnia due to other mental disorder: Secondary | ICD-10-CM

## 2013-06-02 NOTE — Addendum Note (Signed)
Addended byGeraldine Contras on: 06/02/2013 12:59 PM   Modules accepted: Orders

## 2013-06-15 ENCOUNTER — Ambulatory Visit (INDEPENDENT_AMBULATORY_CARE_PROVIDER_SITE_OTHER): Payer: BC Managed Care – HMO | Admitting: Nurse Practitioner

## 2013-06-15 ENCOUNTER — Encounter: Payer: Self-pay | Admitting: Nurse Practitioner

## 2013-06-15 VITALS — BP 121/75 | HR 118 | Temp 97.5°F | Ht 65.0 in | Wt 158.0 lb

## 2013-06-15 DIAGNOSIS — E1142 Type 2 diabetes mellitus with diabetic polyneuropathy: Secondary | ICD-10-CM

## 2013-06-15 DIAGNOSIS — G4700 Insomnia, unspecified: Secondary | ICD-10-CM

## 2013-06-15 DIAGNOSIS — R519 Headache, unspecified: Secondary | ICD-10-CM

## 2013-06-15 DIAGNOSIS — R51 Headache: Secondary | ICD-10-CM

## 2013-06-15 DIAGNOSIS — G43709 Chronic migraine without aura, not intractable, without status migrainosus: Secondary | ICD-10-CM

## 2013-06-15 DIAGNOSIS — E1149 Type 2 diabetes mellitus with other diabetic neurological complication: Secondary | ICD-10-CM

## 2013-06-15 DIAGNOSIS — E1140 Type 2 diabetes mellitus with diabetic neuropathy, unspecified: Secondary | ICD-10-CM

## 2013-06-15 MED ORDER — TRAMADOL HCL 50 MG PO TABS: 50.0000 mg | ORAL_TABLET | Freq: Two times a day (BID) | ORAL | Status: DC | PRN

## 2013-06-15 NOTE — Progress Notes (Signed)
[facility]  PATIENT: Bridget [name] DOB: Aug 22, 1970   REASON FOR VISIT: follow up HISTORY FROM: patient  HISTORY OF PRESENT ILLNESS: Bridget [name] is a 42 y.o. female Is seen here as a referral/ revisit from Dr. [name] for migraine evaluation and treatment . She wants to be looked at for foot pain as well.  She has seen 3 other neurologists before.  [name] reports that a history of headaches since young adulthood. She describes her headaches as bitemporal bifrontal. They sometimes throbbing, pulsatile. At times they have felt as a ice pick or hammer would hit her-  Headaches in to be worse when she wakes up in the morning. She can wake up frequently at night and notices her headaches but it seems that the headaches don't necessarily wake her. At times she will have blurred vision with her headaches but she has not noticed a visual field stricture. There is no light perception or flickering lights. This is not the classic visual aura of migraines. If the headaches reach a level above 8/10 and she may feel nauseated, she never had to vomit.  Heat and humidity , strong odors may trigger a headache. A headache will typically last all day. Phonophobia is not present, but photophobia.  NSAIDS will give her a relief of 2-3 hours, than the headache rebounds. She has 7 days a week the same headaches , and takes daily NSAIDS.  She cut caffeine, chocolate and cheeses from her diet. She failed to see any menstrual triggers.  Classic imitrex, and frova, tramadol and Esgic all failed.  Prevention with Depakote failed, and propranolol, Topamax as well.  She developed gestational diabetes with her first pregnancy at age 32, but was not moderately obese at the time. Her second pregnancy went by without gestational diabetes, 14 month later.  She was diagnosed finally with diabetes at age 15. At the time she states she was slim but she is now. Over the last 4 years she has developed  dysesthesia is at times painful burning in both feet, presumed to be a diabetic neuropathy.  She reports poor sleep, non restorative, Has been told that she snores when supine , but has not been told about any apneas.  No nocturia. Wakes up frequently , with a severe headache that doesn't let up as the day goes on.   UPDATE 06/15/13 (LL):  Patient returns for follow up visit, she has not had any improvement with taking Imipramine.  She states that her headaches are still daily. They are the worst in the morning but last most of the day.  She states that she had the best response from Prednisone.  She has tried and failed at least 3 preventative medications (Propranolol, Topamax, Depakote Zonegran, Butterbur, Magnesium/Riboflavin/CoQ10 Combo), abortives (Imitrex, Maxalt, Midrin, Relpa, Zomig), 2 muscle relaxants (flexeril, Robaxin) and 7 SSRI/SNRIs with no relief or side effects and had 2 treatments of Botox injections with some relief but she lost her insurance. She asks for another Prednisone pack and Ambien to sleep.  REVIEW OF SYSTEMS: Full 14 system review of systems performed and notable only for:  Constitutional: weight loss  Endocrine: feeling hot Neurological: headache Sleep: Insomnia   ALLERGIES: Allergies  Allergen Reactions  . Ibuprofen     GI upset  . Naprosyn [Naproxen]     GI upset  . Simvastatin     headache    HOME MEDICATIONS: Outpatient Prescriptions Prior to Visit  Medication Sig Dispense Refill  . aspirin  81 MG tablet Take 81 mg by mouth daily.      . Canagliflozin (INVOKANA) 100 MG TABS Take by mouth daily.      . fluconazole (DIFLUCAN) 150 MG tablet       . glimepiride (AMARYL) 4 MG tablet Take 4 mg by mouth 2 (two) times daily.      Marland Kitchen glucose blood test strip 1 each by Other route as needed for other. Ultra blue strip,Use as instructed      . imipramine (TOFRANIL) 50 MG tablet Take 1 tablet (50 mg total) by mouth at bedtime.  30 tablet  3  . insulin detemir  (LEVEMIR) 100 UNIT/ML injection Inject into the skin at bedtime. As needed      . LOSARTAN POTASSIUM PO Take 100 mg by mouth daily.      . metFORMIN (GLUCOPHAGE) 500 MG tablet Take 500 mg by mouth 2 (two) times daily with a meal.      . norgestimate-ethinyl estradiol (SPRINTEC 28) 0.25-35 MG-MCG tablet Take 1 tablet by mouth daily.      Marland Kitchen PROMETHAZINE HCL PO Take 25 mg by mouth. One tablet every 4-6 hours as needed      . l-methylfolate-B6-B12 (METANX) 3-35-2 MG TABS Take 1 tablet by mouth daily.  90 tablet  1  . predniSONE (DELTASONE) 5 MG tablet Take 1 tablet (5 mg total) by mouth daily.  21 tablet  0  . zolpidem (AMBIEN) 10 MG tablet Take 10 mg by mouth at bedtime as needed for sleep.       Facility-Administered Medications Prior to Visit  Medication Dose Route Frequency Provider Last Rate Last Dose  . valproate (DEPACON) 500 mg in sodium chloride 0.9 % 100 mL IVPB  500 mg Intravenous Continuous Melvyn Novas, MD   500 mg at 04/27/13 1145    PAST MEDICAL HISTORY: Past Medical History  Diagnosis Date  . Diabetes mellitus without complication   . Anemia   . Hyperlipidemia   . Hypertension   . Insomnia   . Nephrolithiasis   . 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   Marland Kitchen Neuropathy in diabetes 04/27/2013  . 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 Mouth surgery  1987    wisdom teeth  . Cesarean section  2000  . Laparoscopy      therapeutic  . Endometreosis  07/10/2004    FAMILY HISTORY: Family History  Problem Relation Age of Onset  . Alzheimer's disease Paternal Grandmother     SOCIAL HISTORY: History   Social History  . Marital Status: Single    Spouse Name: [name]    Number of Children: 2  . Years of Education: 16   Occupational History  .      not employed   Social History Main Topics  . Smoking status: Never Smoker   . Smokeless tobacco:  Never Used  . Alcohol Use: No  . Drug Use: No  . Sexual Activity: No   Other Topics Concern  . Not on file   Social History Narrative  . No narrative on file   PHYSICAL EXAM  Filed Vitals:   06/15/13 1021  BP: 121/75  Pulse: 118  Temp: 97.5 F (36.4 C)  TempSrc: Oral  Height: 5\' 5"  (1.651 m)  Weight: 158 lb (71.668 kg)   Body mass index is 26.29 kg/(m^2).  Physical exam:  General: The patient is awake, alert and appears not in acute distress. The patient is well groomed.  Head: Normocephalic, atraumatic. Neck is supple. Mallampati 3 , neck circumference: 15 inches ,  Cardiovascular: Regular rate and rhythm , without murmurs or carotid bruit, and without distended neck veins.  Respiratory: Lungs are clear to auscultation.  Skin: Without evidence of edema, or rash  Trunk: BMI is elevated and patient has normal posture.   Neurologic exam :  The patient is awake and alert, oriented to place and time. Memory subjective described as intact. There is a normal attention span & concentration ability.  Speech is fluent without dysarthria, dysphonia or aphasia.  Mood and affect are appropriate.  Cranial nerves:  Pupils are equal and briskly reactive to light. Extraocular movements in vertical and horizontal planes intact and without nystagmus. Visual fields by finger perimetry are intact.  Hearing to finger rub intact. Facial sensation intact to fine touch. Facial motor strength is symmetric and tongue and uvula move midline.  Motor exam: Normal tone and normal muscle bulk and symmetric normal strength in all extremities.  Sensory: Fine touch, pinprick and vibration were tested in all extremities. Her feet are less responsive to filament touch and vibration. Proprioception is tested and Normal. She wakes frequently with hand and finger numbness , "falling asleep".  Coordination: Rapid alternating movements in the fingers/hands is tested and normal.  Finger-to-nose maneuver tested and  normal without evidence of ataxia, dysmetria or tremor.  Gait and station: Patient walks without assistive device , unassisted able to climb up to the exam table. Strength within normal limits. Stance is stable and normal. Steps are unfragmented. Romberg testing is normal.  Deep tendon reflexes: in the upper and lower extremities are symmetric and intact. Babinski maneuver response is downgoing.   DIAGNOSTIC DATA (LABS, IMAGING, TESTING) - I reviewed patient records, labs, notes, testing and imaging myself where available.  Reviewed MRI and CT reports- no pathology noted.  CRP, sed rate, ANA, ACE all negative  ASSESSMENT AND PLAN UMrs. Rubbie Goostree is a 42 year-old Caucasian female with history of Diabetes Mellitus, Type 2 , Insomnia and Chronic, almost daily Migraine  She felt improvement on prednisone, but cannot take this medication due to DM.  Extensive discussion and information , Likely rebound component/mixed migraine headaches from continued overuse of NSAIDS, and und unfortunately Resorted to narcotic pain medication, which will not be refilled from here.   Neuropathy of diabetic origin. Can lead to pain, RLS and worsening of insomnia. I again refrained from narcotic pain medications.  Apparently non organic, chronic insomnia.   Plan: Treatment plan and additional workup :  EMG and NCS for upper extremities , since she has shoulder and neck pain, wakes up with her hands feeling asleep - cervical tension, DDD or carpal tunnel?   Continue Imipramine 50 mg for insomnia and tension pain.  Denied request for another Prednisone pack even though she states it "lowered her blood sugar" (?) Wrote for Tramadol 50 mg to use for only prn severe headaches that impede daily function. We will get her to sign consent for Botox injections, had relief previously and literally out of options. Please note , that non organic insomnia, such as seen in depression, anxiety or substance abuse will not be  treated in a neurological sleep clinic, and may require referral to a psychologist and counselor. Thus, I denied her request for Ambien. I hope that the tricyclic will help with neuropathic pain and dyseasthesias.  Orders Placed This Encounter  Procedures  . NCV with EMG(electromyography)   Meds ordered this encounter  Medications  . traMADol (ULTRAM) 50 MG tablet    Sig: Take 1 tablet (50 mg total) by mouth every 12 (twelve) hours as needed for moderate pain (headaches).    Dispense:  30 tablet    Refill:  1    Order Specific Question:  Supervising Provider    Answer:  Vickey Huger, CARMEN [[id number]]   Ronal Fear, MSN, NP-C 06/15/2013, 1:38 PM [facility] [address] [place], [place] [address] [phone]  Note: This document was prepared with digital dictation and possible smart phrase technology. Any transcriptional errors that result from this process are unintentional.

## 2013-06-15 NOTE — Patient Instructions (Addendum)
Continue Impramine each night.  We will try to get Botox injections approved with your insurance.  Someone will call you to schedule an appointment to come in once it is approved, or let you know it was not approved.  NCV/EMG study, someone will call to schedule.  Electromyography (EMG) Test This is a test in which very small electrodes are placed into your muscle tissue. It looks at the electrical impulses of your muscle tissue. This test is used to determine whether or not there are involuntary or spontaneous muscle movements. Involuntary or spontaneous means muscle movements that happen by themselves. This may indicate injury or disease of the nerves which supply that muscle. PREPARATION FOR TEST No preparation or fasting is necessary. Some stimulants such as caffeine and tobacco may need to be avoided for 2-3 hours before test or as instructed by your caregiver. NORMAL FINDINGS No evidence of neuromuscular abnormalities. Ranges for normal findings may vary among different laboratories and hospitals. You should always check with your doctor after having lab work or other tests done to discuss the meaning of your test results and whether your values are considered within normal limits. MEANING OF TEST  Your caregiver will go over the test results with you and discuss the importance and meaning of your results, as well as treatment options and the need for additional tests if necessary. OBTAINING THE TEST RESULTS It is your responsibility to obtain your test results. Ask the lab or department performing the test when and how you will get your results. Document Released: 11/28/2004 Document Revised: 10/20/2011 Document Reviewed: 07/07/2008 [facility] Patient Information 2014 [place], [place].

## 2013-06-22 ENCOUNTER — Encounter: Payer: BC Managed Care – HMO | Admitting: Diagnostic Neuroimaging

## 2013-06-26 ENCOUNTER — Other Ambulatory Visit: Payer: Self-pay | Admitting: Nurse Practitioner

## 2013-06-29 ENCOUNTER — Encounter: Payer: Self-pay | Admitting: Nurse Practitioner

## 2013-07-15 ENCOUNTER — Other Ambulatory Visit: Payer: Self-pay | Admitting: Nurse Practitioner

## 2013-07-15 NOTE — Telephone Encounter (Signed)
Larita Fife, I see you prescribed #30 + 1 refill of Tramadol for this patient last month on 11/05 at [facility].  Per notes: Wrote for Tramadol 50 mg to use for only prn severe headaches that impede daily function. Pharmacy is requesting a refill.  I called them and they say the patient has already used both refills and is requesting more meds.  Would you like to fill again?

## 2013-07-18 ENCOUNTER — Other Ambulatory Visit: Payer: Self-pay | Admitting: Nurse Practitioner

## 2013-07-18 NOTE — Telephone Encounter (Signed)
Per last request Friday: Reason for Refusal: Patient has requested refill too soon Refused By: Ronal Fear, NP

## 2013-07-28 ENCOUNTER — Telehealth: Payer: Self-pay | Admitting: Neurology

## 2013-07-28 NOTE — Telephone Encounter (Signed)
LMVM to inform patient injections were not approved by her insurance

## 2013-08-22 ENCOUNTER — Other Ambulatory Visit: Payer: Self-pay | Admitting: Nurse Practitioner

## 2013-08-22 ENCOUNTER — Other Ambulatory Visit: Payer: Self-pay | Admitting: Neurology

## 2013-08-23 ENCOUNTER — Other Ambulatory Visit: Payer: Self-pay | Admitting: Nurse Practitioner

## 2013-08-23 NOTE — Telephone Encounter (Signed)
Patient following up on Tramadol refill.

## 2013-09-02 ENCOUNTER — Ambulatory Visit: Payer: BC Managed Care – HMO | Admitting: Neurology

## 2013-09-19 ENCOUNTER — Other Ambulatory Visit: Payer: Self-pay | Admitting: Nurse Practitioner

## 2013-09-19 NOTE — Telephone Encounter (Signed)
Melissa recommended we put "must last XX (number of days)" on next refill so she is not able to refill meds so soon.

## 2013-09-19 NOTE — Telephone Encounter (Signed)
We just sent #30 + 1 refill on 01/12 to the pharmacy.  I called the pharmacy.  Spoke with Melissa.  She said the patient filled #30 on 01/14 and filled the other #30 on 01/26.  Claims she needs another refill.  (This same situation happened in Dec and Larita FifeLynn said she was only to use for only prn severe headaches that impede daily function.)  I called patient on both cell and home number, got no answer on either line.

## 2013-09-20 ENCOUNTER — Telehealth: Payer: Self-pay

## 2013-09-20 ENCOUNTER — Other Ambulatory Visit: Payer: Self-pay | Admitting: Nurse Practitioner

## 2013-09-20 NOTE — Telephone Encounter (Signed)
Tried to call patient regarding refill, got no answer on either line.  Left message on both lines.

## 2013-09-29 ENCOUNTER — Other Ambulatory Visit: Payer: Self-pay | Admitting: Nurse Practitioner

## 2013-09-30 NOTE — Telephone Encounter (Signed)
Would you like to refill?  The pharmacist recommended we write must last xx amount of days, if desired, to avoid the patient getting refills too soon.

## 2013-09-30 NOTE — Telephone Encounter (Signed)
Must last 30 days. Do not refill early

## 2013-10-03 ENCOUNTER — Telehealth: Payer: Self-pay | Admitting: Neurology

## 2013-10-03 NOTE — Telephone Encounter (Signed)
I called and spoke to the patient.  Explained per [place] from [place] that each fill must last 30 days-no early refills allowed.  Patient verbalized understanding.

## 2013-10-03 NOTE — Telephone Encounter (Signed)
Rx has been faxed.

## 2013-10-03 NOTE — Telephone Encounter (Signed)
Patient states her pharmacy faxed us refill request for Tramadol and has not heard anything back from us yet.

## 2013-11-09 ENCOUNTER — Encounter: Payer: Self-pay | Admitting: Neurology

## 2013-11-09 ENCOUNTER — Encounter (INDEPENDENT_AMBULATORY_CARE_PROVIDER_SITE_OTHER): Payer: Self-pay

## 2013-11-09 ENCOUNTER — Ambulatory Visit (INDEPENDENT_AMBULATORY_CARE_PROVIDER_SITE_OTHER): Payer: BC Managed Care – HMO | Admitting: Neurology

## 2013-11-09 VITALS — BP 113/81 | HR 126 | Resp 17 | Ht 65.5 in | Wt 141.0 lb

## 2013-11-09 DIAGNOSIS — E1149 Type 2 diabetes mellitus with other diabetic neurological complication: Secondary | ICD-10-CM

## 2013-11-09 DIAGNOSIS — G43909 Migraine, unspecified, not intractable, without status migrainosus: Secondary | ICD-10-CM

## 2013-11-09 DIAGNOSIS — G44229 Chronic tension-type headache, not intractable: Secondary | ICD-10-CM

## 2013-11-09 DIAGNOSIS — L74519 Primary focal hyperhidrosis, unspecified: Secondary | ICD-10-CM

## 2013-11-09 DIAGNOSIS — R61 Generalized hyperhidrosis: Secondary | ICD-10-CM

## 2013-11-09 MED ORDER — ZOLPIDEM TARTRATE 10 MG PO TABS
10.0000 mg | ORAL_TABLET | Freq: Every evening | ORAL | Status: DC | PRN
Start: 2013-11-09 — End: 2013-12-07

## 2013-11-09 MED ORDER — PREDNISONE 5 MG PO TABS: 5.0000 mg | ORAL_TABLET | Freq: Every day | ORAL | Status: AC

## 2013-11-09 MED ORDER — ONABOTULINUMTOXINA 100 UNITS IJ SOLR: 100.0000 [IU] | Freq: Once | INTRAMUSCULAR | Status: AC

## 2013-11-09 MED ORDER — TRAMADOL HCL 50 MG PO TABS: 50.0000 mg | ORAL_TABLET | Freq: Two times a day (BID) | ORAL | Status: DC

## 2013-11-09 NOTE — Progress Notes (Signed)
[facility]  PATIENT: Bridget [name] DOB: [birth year]   REASON FOR VISIT: follow up for migraines .  HISTORY FROM: patient  HISTORY OF PRESENT ILLNESS: Bridget [name] is a 43 y.o. female Is seen here as a  revisit from Dr. [name] for migraine evaluation and treatment, she changed to endocrinologist March Rummage for diabetes control.  Diabetic since age 47, she has brittle diabetes and associated hyper and hypo- glycemia , causing confusion, hot and cold sensation, diaphoresis and insomnia. The patient feels,  that her headaches a more tension headaches,  rather than migraines.  States that her headache is more occipital and chronic , she does not report an aura or any visual warning for her headaches -and they may wax and wean in intensity,  but they seem never to be truly gone. Some of her headaches are bitemporal and bifrontal almost like a sinus headaches. She may have blurry vision,  but she does not have rectal orbital pressure and pain.   Her neck pops and crackles when she was indicating a tension complement is truly present. As she has described in the past, the heat and humidity can trigger a headache as well as strong odors . Sleep deprivation will typically make headaches worse and he for irritable. Bright light and loud sounds irritate her as well.  She reports headaches 10 times a month as stronger than baseline HA.   Last visit note :  She has seen 3 other neurologists before.  Bridget [name] reports that a history of headaches since young adulthood. She describes her headaches as bitemporal bifrontal. They sometimes throbbing, pulsatile. At times they have felt as a ice pick or hammer would hit her-  Headaches in to be worse when she wakes up in the morning. She can wake up frequently at night and notices her headaches but it seems that the headaches don't necessarily wake her. At times she will have blurred vision with her headaches but she has not noticed a  visual field stricture. There is no light perception or flickering lights. This is not the classic visual aura of migraines. If the headaches reach a level above 8/10 and she may feel nauseated, she never had to vomit.  Heat and humidity , strong odors may trigger a headache. A headache will typically last all day. Phonophobia is not present, but photophobia.  NSAIDS will give her a relief of 2-3 hours, than the headache rebounds. She has 7 days a week the same headaches , and takes daily NSAIDS.  She cut caffeine, chocolate and cheeses from her diet. She failed to see any menstrual triggers.  Classic imitrex, and frova, tramadol and Esgic all failed.  Prevention with Depakote failed, and propranolol, Topamax as well.  She developed gestational diabetes with her first pregnancy at age 33, but was not moderately obese at the time. Her second pregnancy went by without gestational diabetes, 14 month later.  She was diagnosed finally with diabetes at age 87. At the time she states she was slim but she is now. Over the last 4 years she has developed dysesthesia is at times painful burning in both feet, presumed to be a diabetic neuropathy.  She reports poor sleep, non restorative, Has been told that she snores when supine , but has not been told about any apneas.  No nocturia. Wakes up frequently , with a severe headache that doesn't let up as the day goes on.   UPDATE 06/15/13 (LL):  Patient returns  for follow up visit, she has not had any major improvement with taking Imipramine- it helped to sleep, but not with the HA.  She states that her headaches are still daily. They are the worst in the morning but last most of the day.  She states that she had the best response from Prednisone.     She has tried and failed at least 3 preventative medications (Propranolol, Topamax, Depakote Zonegran, Butterbur, Magnesium/Riboflavin/CoQ10 Combo), abortives (Imitrex, Maxalt, Midrin, RelpaX, Zomig), 2 muscle relaxants  (flexeril, Robaxin) and 7 SSRI/SNRIs with no relief or side effects and had 2 treatments of Botox injections with some relief but she lost her insurance.   She asks for another Prednisone pack and Ambien to sleep.  REVIEW OF SYSTEMS: Full 14 system review of systems performed and notable only for:  Constitutional: weight loss  Endocrine: feeling hot Neurological: headache Sleep: Insomnia   ALLERGIES: Allergies  Allergen Reactions  . Ibuprofen     GI upset  . Naprosyn [Naproxen]     GI upset  . Simvastatin     headache    HOME MEDICATIONS: Outpatient Prescriptions Prior to Visit  Medication Sig Dispense Refill  . aspirin 81 MG tablet Take 81 mg by mouth daily.      . Canagliflozin (INVOKANA) 100 MG TABS Take by mouth daily.      Marland Kitchen. glimepiride (AMARYL) 4 MG tablet Take 4 mg by mouth 2 (two) times daily.      Marland Kitchen. glucose blood test strip 1 each by Other route as needed for other. Ultra blue strip,Use as instructed      . imipramine (TOFRANIL) 50 MG tablet TAKE 1 TABLET AT BEDTIME  30 tablet  6  . LOSARTAN POTASSIUM PO Take 100 mg by mouth daily.      . metFORMIN (GLUCOPHAGE) 500 MG tablet Take 500 mg by mouth 2 (two) times daily with a meal.      . norgestimate-ethinyl estradiol (SPRINTEC 28) 0.25-35 MG-MCG tablet Take 1 tablet by mouth daily.      . traMADol (ULTRAM) 50 MG tablet TAKE 1 TABLET EVERY 12 HOURS AS NEEDED MODERATE PAIN (HEADACHES)  60 tablet  2  . fluconazole (DIFLUCAN) 150 MG tablet       . insulin detemir (LEVEMIR) 100 UNIT/ML injection Inject into the skin at bedtime. As needed      . PROMETHAZINE HCL PO Take 25 mg by mouth. One tablet every 4-6 hours as needed       Facility-Administered Medications Prior to Visit  Medication Dose Route Frequency Provider Last Rate Last Dose  . valproate (DEPACON) 500 mg in sodium chloride 0.9 % 100 mL IVPB  500 mg Intravenous Continuous Melvyn Novasarmen Somya Jauregui, MD   500 mg at 04/27/13 1145    PAST MEDICAL HISTORY: Past Medical  History  Diagnosis Date  . Diabetes mellitus without complication   . Anemia   . Hyperlipidemia   . Hypertension   . Insomnia   . Nephrolithiasis   . 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   Marland Kitchen. 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 Mouth surgery  1987    wisdom teeth  . Cesarean section  2000  . Laparoscopy      therapeutic  . Endometreosis  07/10/2004    FAMILY HISTORY: Family History  Problem Relation Age  of Onset  . Alzheimer's disease Paternal Grandmother     SOCIAL HISTORY: History   Social History  . Marital Status: Single    Spouse Name: [name]    Number of Children: 2  . Years of Education: 16   Occupational History  .      not employed   Social History Main Topics  . Smoking status: Never Smoker   . Smokeless tobacco: Never Used  . Alcohol Use: No  . Drug Use: No  . Sexual Activity: No   Other Topics Concern  . Not on file   Social History Narrative   Patient is married Iantha Fallen) and lives with her husband and two children.   Patient is currently unemployed.   Patient has a college education.   Patient is right-handed.   Patient drinks one soda daily.   PHYSICAL EXAM  Filed Vitals:   11/09/13 1219  BP: 113/81  Pulse: 126  Resp: 17  Height: 5' 5.5" (1.664 m)  Weight: 141 lb (63.957 kg)   Body mass index is 23.1 kg/(m^2).  Physical exam:  General: The patient is awake, alert and appears not in acute distress. The patient is well groomed.  Head: Normocephalic, atraumatic. Neck is supple. Mallampati 3 , neck circumference: 15 inches ,  Cardiovascular: Regular rate and rhythm , without murmurs or carotid bruit, and without distended neck veins.  Respiratory: Lungs are clear to auscultation.  Skin: Without evidence of edema, or rash  Trunk: BMI is elevated and patient has normal  posture.   Neurologic exam :  The patient is awake and alert, oriented to place and time.  Memory subjective described as intact. There is a normal attention span & concentration ability.  Speech is fluent without dysarthria, dysphonia or aphasia.  Mood and affect are appropriate.  Cranial nerves:  Pupils are equal and briskly reactive to light. Extraocular movements in vertical and horizontal planes intact and without nystagmus. Visual fields by finger perimetry are intact.  Hearing to finger rub intact. Facial sensation intact to fine touch. Facial motor strength is symmetric and tongue and uvula move midline.  Motor exam: Normal tone and normal muscle bulk and symmetric normal strength in all extremities.  Sensory: Fine touch, pinprick and vibration were tested in all extremities. Her feet are less responsive to filament touch and vibration. Proprioception is tested and normal.  She woke  frequently with hand and finger numbness , "falling asleep", which as resolved after prednisone. .  Coordination: Rapid alternating movements in the fingers/hands is tested and normal.  Finger-to-nose maneuver tested and normal without evidence of ataxia, dysmetria or tremor.  Gait and station: Patient walks without assistive device , unassisted able to climb up to the exam table. When standing up, she feels lightheaded for a brief moment.   Strength within normal limits. Stance is stable and normal. Romberg testing is normal.  Deep tendon reflexes: in the upper and lower extremities are symmetric and intact. Babinski maneuver response is downgoing.   DIAGNOSTIC DATA (LABS, IMAGING, TESTING) - I reviewed patient records, labs, notes, testing and imaging myself where available.  Reviewed MRI and CT reports- no pathology noted.  CRP, sed rate, ANA, ACE all negative  ASSESSMENT AND PLAN UMrs. Jonessa [name] is a 43 year-old Caucasian female with history of Diabetes Mellitus, Type 2  ( paternal great  grandtmother is the only other family member with diabetes ,  Insomnia , chronic- temporarily improved on Imipramine.   Chronic HA , with some  Migraine character and some tension type character.     She felt improvement on prednisone, but cannot take this medication due to DM. She developed signs of diabetic dysautonomia.  Extensive discussion and information , Likely rebound component/mixed migraine headaches from continued overuse of NSAIDS, and und unfortunately Resorted to narcotic pain medication, which will not be refilled from here.   Neuropathy of diabetic origin. Can lead to pain, RLS and worsening of insomnia. I again refrained from narcotic pain medications.  Apparently non organic, chronic insomnia.   Plan: Treatment plan and additional workup :  EMG and NCS for upper extremities , since she has shoulder and neck pain, wakes up with her hands feeling asleep - cervical tension, DDD or carpal tunnel?   . I refilled  another Prednisone pack, caveat in DM,  even though she states it "lowered her blood sugar" (?) Wrote for Tramadol 50 mg to use for only prn severe headaches that impede daily function.   We will get her to sign consent for Botox injections, had relief previously and literally out of options. Please note, that non organic insomnia, such as seen in depression, anxiety or substance abuse will not be treated in a neurological sleep clinic, and may require referral to a psychologist and counselor.  She has done meditation and massage with some success. Advised of  insomnia meditation through App: " CALM" .  Thus, I denied her request for continuous Ambien, she will receive 5 mg Ambien, to be taken prn. . She failed amitryptiline and imipramine for HA - neuropathic pain and dyseasthesias.    Referral to a headache specialist is recommended. [facility] functions as her PCP>     Melvyn Novas , MD  11/09/2013, 12:47 PM [facility] [address], [address] [place], [place] [address] [phone]  Note: This document was prepared with digital dictation and possible smart phrase technology. Any transcriptional errors that result from this process are unintentional.

## 2013-11-09 NOTE — Patient Instructions (Signed)
Patient to follow with Dr Terrace ArabiaYan , Frances FurbishAthar or Hosie PoissonSumner for BOTOX.

## 2013-11-10 ENCOUNTER — Telehealth: Payer: Self-pay | Admitting: Neurology

## 2013-11-10 NOTE — Telephone Encounter (Signed)
Spoke with patient to schedule appointment with Dr. Hosie PoissonSumner for evaluation of Botox, patient is scheduled for 11/17/13 at 10 am.

## 2013-11-17 ENCOUNTER — Encounter: Payer: Self-pay | Admitting: Neurology

## 2013-11-17 ENCOUNTER — Ambulatory Visit (INDEPENDENT_AMBULATORY_CARE_PROVIDER_SITE_OTHER): Payer: BC Managed Care – HMO | Admitting: Neurology

## 2013-11-17 VITALS — BP 125/83 | HR 96 | Ht 65.0 in | Wt 150.0 lb

## 2013-11-17 DIAGNOSIS — R51 Headache: Secondary | ICD-10-CM

## 2013-11-17 DIAGNOSIS — R519 Headache, unspecified: Secondary | ICD-10-CM

## 2013-11-17 DIAGNOSIS — G43009 Migraine without aura, not intractable, without status migrainosus: Secondary | ICD-10-CM

## 2013-11-17 MED ORDER — CANDESARTAN CILEXETIL 16 MG PO TABS: 16.0000 mg | ORAL_TABLET | Freq: Every day | ORAL | Status: DC

## 2013-11-17 NOTE — Progress Notes (Signed)
[facility]  PATIENT: Bridget [name] DOB: 05/28/1971   REASON FOR VISIT: follow up for migCasimer Bilisraines .  HISTORY FROM: patient  HISTORY OF PRESENT ILLNESS: Bridget [name] [name] is a 43 y.o. female Is seen here as a  Referral from Dr. Vickey Hugerohmeier for migraine evaluation and treatment.   First headache was around 15 years ago, currently having a chronic daily headache, currently will then have 15 or more severe spikes throughout a month. Headache lasts 8 hours to all day. Headache is generalized, pounding type pain, can get up to a 10/10 at its worst, typically a 4/10. + Nausea and emesis, + photophobia, + blurred vision, no focal motor or sensory changes. No aura noted, severe headaches progress quickly. No triggering factors. In the past has tried multiple medications for prevention, tried topamax with no benefit, VPA with no benefit, tried a beta blocker with no benefit, tried elavil with no benefit. States she got benefit from Botox in the past but was stopped due to insurance cancelling it. For abortive therapy will use tramadol which gives some relief. Has tried triptans in the past with minimal benefit.    She has tried and failed at least 3 preventative medications (Propranolol, Topamax, Depakote Zonegran, Elavil, Butterbur, Magnesium/Riboflavin/CoQ10 Combo), abortives (Imitrex, Maxalt, Midrin, RelpaX, Zomig), 2 muscle relaxants (flexeril, Robaxin).   REVIEW OF SYSTEMS: Full 14 system review of systems performed and notable only for:  Constitutional: weight loss  Endocrine: feeling hot Neurological: headache Sleep: Insomnia   ALLERGIES: Allergies  Allergen Reactions  . Ibuprofen     GI upset  . Naprosyn [Naproxen]     GI upset  . Simvastatin     headache    HOME MEDICATIONS: Outpatient Prescriptions Prior to Visit  Medication Sig Dispense Refill  . aspirin 81 MG tablet Take 81 mg by mouth daily.      . botulinum toxin Type A (BOTOX) 100 UNITS SOLR injection  Inject 100 Units into the muscle once.  1 vial  0  . Canagliflozin (INVOKANA) 100 MG TABS Take by mouth daily.      Marland Kitchen. glimepiride (AMARYL) 4 MG tablet Take 4 mg by mouth 2 (two) times daily.      Marland Kitchen. glucose blood test strip 1 each by Other route as needed for other. Ultra blue strip,Use as instructed      . LOSARTAN POTASSIUM PO Take 100 mg by mouth daily.      . metFORMIN (GLUCOPHAGE) 500 MG tablet Take 500 mg by mouth 2 (two) times daily with a meal.      . norgestimate-ethinyl estradiol (SPRINTEC 28) 0.25-35 MG-MCG tablet Take 1 tablet by mouth daily.      . predniSONE (DELTASONE) 5 MG tablet Take 1 tablet (5 mg total) by mouth daily with breakfast.  60 tablet  0  . traMADol (ULTRAM) 50 MG tablet Take 1 tablet (50 mg total) by mouth 2 (two) times daily.  60 tablet  0  . zolpidem (AMBIEN) 10 MG tablet Take 1 tablet (10 mg total) by mouth at bedtime as needed for sleep. Take 1/2 tab at bedtime to help with insomnia , do not exceed 5 out of 7 nights.  28 tablet  0   Facility-Administered Medications Prior to Visit  Medication Dose Route Frequency Provider Last Rate Last Dose  . valproate (DEPACON) 500 mg in sodium chloride 0.9 % 100 mL IVPB  500 mg Intravenous Continuous Melvyn Novasarmen Dohmeier, MD   500 mg at 04/27/13 1145  PAST MEDICAL HISTORY: Past Medical History  Diagnosis Date  . Diabetes mellitus without complication   . Anemia   . Hyperlipidemia   . Hypertension   . Insomnia   . Nephrolithiasis   . 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   Marland Kitchen Neuropathy in diabetes 04/27/2013  . 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 Mouth surgery  1987    wisdom teeth  . Cesarean section  2000  . Laparoscopy      therapeutic  . Endometreosis  07/10/2004    FAMILY HISTORY: Family History  Problem Relation Age of Onset  . Alzheimer's disease Paternal Grandmother      SOCIAL HISTORY: History   Social History  . Marital Status: Single    Spouse Name: [name]    Number of Children: 2  . Years of Education: 16   Occupational History  .      not employed   Social History Main Topics  . Smoking status: Never Smoker   . Smokeless tobacco: Never Used  . Alcohol Use: No  . Drug Use: No  . Sexual Activity: No   Other Topics Concern  . Not on file   Social History Narrative   Patient is married Bridget [name]) and lives with her husband and two children.   Patient is currently unemployed.   Patient has a college education.   Patient is right-handed.   Patient drinks one soda daily.   PHYSICAL EXAM  Filed Vitals:   11/17/13 1002  BP: 125/83  Pulse: 96  Height: 5\' 5"  (1.651 m)  Weight: 150 lb (68.04 kg)   Body mass index is 24.96 kg/(m^2).  Physical exam:  General: The patient is awake, alert and appears not in acute distress. The patient is well groomed.  Head: Normocephalic, atraumatic. Neck is supple. Mallampati 3 , neck circumference: 15 inches ,  Cardiovascular: Regular rate and rhythm , without murmurs or carotid bruit, and without distended neck veins.  Respiratory: Lungs are clear to auscultation.  Skin: Without evidence of edema, or rash  Trunk: BMI is elevated and patient has normal posture.   Neurologic exam :  The patient is awake and alert, oriented to place and time.  Memory subjective described as intact. There is a normal attention span & concentration ability.  Speech is fluent without dysarthria, dysphonia or aphasia.  Mood and affect are appropriate.  Cranial nerves:  Pupils are equal and briskly reactive to light. Extraocular movements in vertical and horizontal planes intact and without nystagmus. Visual fields by finger perimetry are intact.  Hearing to finger rub intact. Facial sensation intact to fine touch. Facial motor strength is symmetric and tongue and uvula move midline.  Motor exam: Normal tone and normal  muscle bulk and symmetric normal strength in all extremities.  Sensory: Fine touch, pinprick and vibration were tested in all extremities. Her feet are less responsive to filament touch and vibration. Proprioception is tested and normal.  She woke  frequently with hand and finger numbness , "falling asleep", which as resolved after prednisone. .  Coordination: Rapid alternating movements in the fingers/hands is tested and normal.  Finger-to-nose maneuver tested and normal without evidence of ataxia, dysmetria or tremor.  Gait and station: Patient walks without assistive device , unassisted able to climb up to the exam table. When standing up, she feels lightheaded for a brief  moment.   Strength within normal limits. Stance is stable and normal. Romberg testing is normal.  Deep tendon reflexes: in the upper and lower extremities are symmetric and intact. Babinski maneuver response is downgoing.     ASSESSMENT AND PLAN   1)Migraine without aura 2)Chronic daily headache  Mrs. Tuwanna Krausz is a 43 year-old Caucasian female with history of Diabetes Mellitus, Type 2  ( paternal great grandtmother is the only other family member with diabetes. Presents today for consultation over chronic daily migraine with frequent monthly exacerbations. Has tried multiple prophylactic agents in the past with no benefit. Tried Botox injections with some benefit but discontinued due to loss of insurance. Due to debilitating nature of her symptoms she is unable to function and the headaches are negatively impacting her quality of life. Will start candasartan 16mg  daily and place referral for Botox therapy. Follow up once approval granted.     Elspeth Cho, DO  11/17/2013, 10:39 AM [facility] [address] [place], [place] [address] [phone]  Note: This document was prepared with digital dictation and possible smart phrase technology. Any transcriptional errors that result from this  process are unintentional.

## 2013-11-17 NOTE — Patient Instructions (Addendum)
Overall you are doing fairly well but I do want to suggest a few things today:   Remember to drink plenty of fluid, eat healthy meals and do not skip any meals. Try to eat protein with a every meal and eat a healthy snack such as fruit or nuts in between meals. Try to keep a regular sleep-wake schedule and try to exercise daily, particularly in the form of walking, 20-30 minutes a day, if you can.   As far as your medications are concerned, I would like to suggest the following: 1)Please start Candesartan 16mg  daily for migraine prevention 2)We will place a request for Botox therapy to treat your migraines. Once we get approval we will contact you to schedule this  Follow up once Botox therapy approved. Please call us with any interim questions, concerns, problems, updates or refill requests.   My clinical assistant and will answer any of your questions and relay your messages to me and also relay most of my messages to you.   Our phone number is [phone]. We also have an after hours call service for urgent matters and there is a physician on-call for urgent questions. For any emergencies you know to call 911 or go to the nearest emergency room

## 2013-12-07 ENCOUNTER — Other Ambulatory Visit: Payer: Self-pay | Admitting: Neurology

## 2013-12-07 NOTE — Telephone Encounter (Signed)
Rx signed and faxed.

## 2013-12-08 ENCOUNTER — Other Ambulatory Visit: Payer: Self-pay | Admitting: Neurology

## 2013-12-09 ENCOUNTER — Telehealth: Payer: Self-pay

## 2013-12-09 NOTE — Telephone Encounter (Signed)
High risk of serotonin syndrome due to being on LIMBATROL and SSRI as well as the requested TRAMADOL.  Needs refill from her other prescriber ( neurosurgeon ) . Computer generated red flag warning.

## 2013-12-09 NOTE — Telephone Encounter (Signed)
Message sent to the pharmacy

## 2013-12-09 NOTE — Telephone Encounter (Signed)
Called patient, got no answer.  Left message saying we would not be able to refill pain med per Dr Vickey Hugerohmeier.  As well, we have notified the pharmacy.  (See note)  Melvyn Novasarmen Dohmeier, MD at 12/09/2013 8:39 AM    Status: Signed        High risk of serotonin syndrome due to being on LIMBATROL and SSRI as well as the requested TRAMADOL.  Needs refill from her other prescriber ( neurosurgeon ) . Computer generated red flag warning.

## 2013-12-22 ENCOUNTER — Encounter: Payer: Self-pay | Admitting: Neurology

## 2013-12-22 ENCOUNTER — Encounter (INDEPENDENT_AMBULATORY_CARE_PROVIDER_SITE_OTHER): Payer: Self-pay

## 2013-12-22 ENCOUNTER — Ambulatory Visit (INDEPENDENT_AMBULATORY_CARE_PROVIDER_SITE_OTHER): Payer: BC Managed Care – HMO | Admitting: Neurology

## 2013-12-22 VITALS — BP 139/88 | HR 129 | Ht 65.0 in | Wt 147.0 lb

## 2013-12-22 DIAGNOSIS — G43719 Chronic migraine without aura, intractable, without status migrainosus: Secondary | ICD-10-CM

## 2013-12-22 DIAGNOSIS — G44229 Chronic tension-type headache, not intractable: Secondary | ICD-10-CM

## 2013-12-22 DIAGNOSIS — G43909 Migraine, unspecified, not intractable, without status migrainosus: Secondary | ICD-10-CM

## 2013-12-22 MED ORDER — TRAMADOL HCL 50 MG PO TABS: 50.0000 mg | ORAL_TABLET | Freq: Two times a day (BID) | ORAL | Status: DC

## 2013-12-22 MED ORDER — ZOLPIDEM TARTRATE 10 MG PO TABS: 5.0000 mg | ORAL_TABLET | Freq: Every evening | ORAL | Status: DC | PRN

## 2013-12-22 MED ORDER — ONABOTULINUMTOXINA 100 UNITS IJ SOLR: 155.0000 [IU] | Freq: Once | INTRAMUSCULAR | Status: AC

## 2013-12-22 MED ORDER — DICLOFENAC POTASSIUM(MIGRAINE) 50 MG PO PACK: 50.0000 mg | PACK | Freq: Once | ORAL | Status: DC | PRN

## 2013-12-22 NOTE — Progress Notes (Signed)
[facility]   Provider:  Dr Hosie PoissonSumner Referring Provider: No ref. provider found Primary Care Physician:  No primary provider on file.  CC:  Chronic migraines  HPI:  [name] D Delisi is a 43 y.o. female here as a follow up and initial Botox injections. Did not start atacand at prior visit as she is already taking Losartan. Continues to have a near daily headache for which she will take tramadol.    Contraindications and precautions discussed with patient. Aseptic procedure was observed and patient tolerated procedure. Procedure performed by Dr. Elspeth ChoPeter Nathania Waldman.  The condition has existed for more than 6 months, and pt does not have a diagnosis of ALS, Myasthenia Gravis or Lambert-Eaton Syndrome.  Risks and benefits of injections discussed and pt agrees to proceed with the procedure.  Written consent obtained These injections are medically necessary. He receives good benefits from these injections. These injections do not cause sedations or hallucinations which the oral therapies may cause.  Indication/Diagnosis:chronic migraine  Type of toxin:Botox  Lot #[id number]  Expiration date:05/2016  Injection sites:  Muscle Site    L   R  Corrugator    5   5  Procerus        5  Frontalis    5x2   5x2   Temporalis    5x4   5x4    Occipitalis    5x3   5x3  Trapezius    5x3   5x3  Cervical paraspinals  5x2   5x2  History   Social History  . Marital Status: Single    Spouse Name: Rocky LinkKen    Number of Children: 2  . Years of Education: 16   Occupational History  .      not employed   Social History Main Topics  . Smoking status: Never Smoker   . Smokeless tobacco: Never Used  . Alcohol Use: No  . Drug Use: No  . Sexual Activity: No   Other Topics Concern  . Not on file   Social History Narrative   Patient is married Iantha Fallen(Kenneth) and lives with her husband and two children.   Patient is currently unemployed.   Patient has a college education.   Patient is  right-handed.   Patient drinks one soda daily.    Family History  Problem Relation Age of Onset  . Alzheimer's disease Paternal Grandmother     Past Medical History  Diagnosis Date  . Diabetes mellitus without complication   . Anemia   . Hyperlipidemia   . Hypertension   . Insomnia   . Nephrolithiasis   . 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   Marland Kitchen. 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 Mouth surgery  1987    wisdom teeth  . Cesarean section  2000  . Laparoscopy      therapeutic  . Endometreosis  07/10/2004    Current Outpatient Prescriptions  Medication Sig Dispense Refill  . aspirin 81 MG tablet Take 81 mg by mouth daily.      . botulinum toxin Type A (BOTOX) 100 UNITS SOLR injection Inject 100 Units into the muscle once.  1 vial  0  . Canagliflozin (INVOKANA) 100 MG TABS Take by mouth daily.      . chloridazePOXIDE-amitriptyline (LIMBITROL) 5-12.5 MG per tablet       .  glimepiride (AMARYL) 4 MG tablet Take 4 mg by mouth 2 (two) times daily.      Marland Kitchen. glucose blood test strip 1 each by Other route as needed for other. Ultra blue strip,Use as instructed      . imipramine (TOFRANIL) 50 MG tablet       . LOSARTAN POTASSIUM PO Take 100 mg by mouth daily.      . metFORMIN (GLUCOPHAGE) 500 MG tablet Take 500 mg by mouth 2 (two) times daily with a meal.      . norgestimate-ethinyl estradiol (SPRINTEC 28) 0.25-35 MG-MCG tablet Take 1 tablet by mouth daily.      . pantoprazole (PROTONIX) 40 MG tablet       . predniSONE (DELTASONE) 5 MG tablet Take 1 tablet (5 mg total) by mouth daily with breakfast.  60 tablet  0  . traMADol (ULTRAM) 50 MG tablet Take 1 tablet (50 mg total) by mouth 2 (two) times daily.  60 tablet  0  . zolpidem (AMBIEN) 10 MG tablet Take 0.5 tablets (5 mg total) by mouth at bedtime as needed for sleep (do not exceed 5 out of 7  nights per week).  25 tablet  1  . candesartan (ATACAND) 16 MG tablet Take 1 tablet (16 mg total) by mouth daily.  30 tablet  6   Current Facility-Administered Medications  Medication Dose Route Frequency Provider Last Rate Last Dose  . valproate (DEPACON) 500 mg in sodium chloride 0.9 % 100 mL IVPB  500 mg Intravenous Continuous Porfirio Mylararmen Dohmeier, MD   500 mg at 04/27/13 1145    Allergies as of 12/22/2013 - Review Complete 12/22/2013  Allergen Reaction Noted  . Ibuprofen  04/26/2013  . Naprosyn [naproxen]  04/26/2013  . Simvastatin  04/26/2013    Vitals: BP 139/88  Pulse 129  Ht 5\' 5"  (1.651 m)  Wt 147 lb (66.679 kg)  BMI 24.46 kg/m2 Last Weight:  Wt Readings from Last 1 Encounters:  12/22/13 147 lb (66.679 kg)   Last Height:   Ht Readings from Last 1 Encounters:  12/22/13 5\' 5"  (1.651 m)      Assessment:  After physical and neurologic examination, review of laboratory studies, imaging, neurophysiology testing and pre-existing records, assessment will be reviewed on the problem list.  Plan:  Treatment plan and additional workup will be reviewed under Problem List.  [name] D Hanel is a 43 y.o. female here for botox injections for chronic migraine headache  1) Botox injections as detailed above. A total of 155 units was used. 0 units wasted 2) Tylenol or Motrin for injection site pain. 3) Medication guide dispensed. 4) Follow up for repeat injections in 3 months 5) Refills of Tramadol and Ambien given, new prescription for Cambia prn given    Elspeth ChoPeter Ceci Taliaferro, DO  [facility] [address] [place], [place] [id number]  Phone [phone] Fax [phone]

## 2013-12-22 NOTE — Patient Instructions (Signed)
Overall you are doing fairly well but I do want to suggest a few things today:   Remember to drink plenty of fluid, eat healthy meals and do not skip any meals. Try to eat protein with a every meal and eat a healthy snack such as fruit or nuts in between meals. Try to keep a regular sleep-wake schedule and try to exercise daily, particularly in the form of walking, 20-30 minutes a day, if you can.   You were given refills of Tramadol and the Ambien. You were also given a prescription for a new medication called Cambia. Please take once daily as needed for breakthrough headaches  You received your first set of Botox injections today. We will call you in 3 months to set up the next round  Please call us with any interim questions, concerns, problems, updates or refill requests.   My clinical assistant and will answer any of your questions and relay your messages to me and also relay most of my messages to you.   Our phone number is [phone]. We also have an after hours call service for urgent matters and there is a physician on-call for urgent questions. For any emergencies you know to call 911 or go to the nearest emergency room

## 2013-12-23 ENCOUNTER — Telehealth: Payer: Self-pay | Admitting: Neurology

## 2013-12-23 MED ORDER — ZOLPIDEM TARTRATE 10 MG PO TABS: 5.0000 mg | ORAL_TABLET | Freq: Every evening | ORAL | Status: AC | PRN

## 2013-12-23 NOTE — Telephone Encounter (Signed)
OV note from 11/09/13 says:  Advised of insomnia meditation through App: " CALM" .  Thus, I denied her request for continuous Ambien, she will receive 5 mg Ambien, to be taken prn. .  I called the patient back.  She said she did not follow the instructions on the bottle because she thought she was to take one half tab and if that didn't work, take one full tab.  I explained the importance of taking meds as prescribed and following instructions on the label.  Patient verbalized understanding.  Patient says one half tab does not work for her and would like a new Rx written for one full tab.  Please advise.  Thank you.

## 2013-12-23 NOTE — Telephone Encounter (Signed)
Patient called and stated tried to get refill for zolpidem (AMBIEN) 10 MG tablet, but was informed by Pharmacy is was too early.  She was taking 1 pill at night, because she thought Dr Vickey Hugerohmeier told her if  half tab didn't work to take 1.  Pharmacy need rx written to indicate 1 tab at bedtime.  Please call patient and advise.  Thanks

## 2013-12-29 ENCOUNTER — Telehealth: Payer: Self-pay | Admitting: Neurology

## 2013-12-29 NOTE — Telephone Encounter (Signed)
Please advise 

## 2013-12-29 NOTE — Telephone Encounter (Signed)
Pharmacist Raynelle FanningJulie with [facility] [phone]) checking to see if Dr. Hosie PoissonSumner is okay with patient getting Ambien refill early. Pharmacy said from their records the instructions have not changed...patient picked up prescription on April 29th for a 70-day supply so they are questioning it as it will be nearly 1-1/2 months early. Please call Raynelle FanningJulie at [facility] to confirm or deny [phone].

## 2013-12-29 NOTE — Telephone Encounter (Signed)
I spoke with Raynelle FanningJulie.  She said the patient is not due for a refill until July 5th according to the directions, and she is noting this on the patient file.

## 2014-01-01 ENCOUNTER — Other Ambulatory Visit: Payer: Self-pay | Admitting: Nurse Practitioner

## 2014-01-03 ENCOUNTER — Telehealth: Payer: Self-pay | Admitting: Neurology

## 2014-01-03 NOTE — Telephone Encounter (Signed)
Patient was given a Rx for #60 (one bid) on 05/14 by Dr Hosie Poisson.

## 2014-01-03 NOTE — Telephone Encounter (Signed)
Patient calling to state that her pharmacy faxed over a refill request for patient's Tramadol and they have not heard anything from [place] yet.

## 2014-01-03 NOTE — Telephone Encounter (Signed)
On 05/01, Dr Dohmeier said we would not be refilling this medcaion, and the patient should request it from her Neurosurgeon.   Dr Hosie Poisson just wrote a 30 day supply on 05/14.  It is too soon to refill this Rx.  I called the patient back, got no answer.  Left message.

## 2014-01-04 ENCOUNTER — Other Ambulatory Visit: Payer: Self-pay | Admitting: Neurology

## 2014-01-04 ENCOUNTER — Telehealth: Payer: Self-pay | Admitting: Neurology

## 2014-01-04 MED ORDER — TRAMADOL HCL 50 MG PO TABS: 100.0000 mg | ORAL_TABLET | Freq: Two times a day (BID) | ORAL | Status: DC | PRN

## 2014-01-04 MED ORDER — LEVETIRACETAM 500 MG PO TABS: 500.0000 mg | ORAL_TABLET | Freq: Two times a day (BID) | ORAL | Status: AC

## 2014-01-04 NOTE — Telephone Encounter (Signed)
Pt is calling to make Dr. Hosie Poisson aware that the Botox did help but did not take the headaches away completely but pt states that she would like to continue with the treatments. Pt also stated that she would like to try another pain medication because tramadol does not work for her and that pt is out of this medicine. Please advise

## 2014-01-04 NOTE — Telephone Encounter (Signed)
Patient calling to state that she forgot to mention to Dr. Hosie Poisson that the Ambien script she was written was written differently and her pharmacy won't refill it due to this. Please call and advise patient.

## 2014-01-04 NOTE — Telephone Encounter (Signed)
No more refills from [facility]

## 2014-01-04 NOTE — Telephone Encounter (Signed)
Bridget [name] with [facility] wanted to make you aware, patient is using another pharmacy and having another MD write rx for zolpidem (AMBIEN) 10 MG tablet and traMADol (ULTRAM) 50 MG tablet.  [name]'s contact # [phone].  Thanks

## 2014-01-04 NOTE — Telephone Encounter (Signed)
I called the patient back.  Got no answer.  Left message. 

## 2014-01-04 NOTE — Telephone Encounter (Signed)
Called pt to inform her per Dr. Hosie Poisson that he increased tramadol to 100 mg and that she needs to limit use to no more than 2-3 times per week as pt likely has a component of medication overuse. Long term will aim to taper off this medication and to start keppra 500mg  BID for migraine ppx and to continue Botox injections and if no improvement refer to headache clinic. Pt wanted to know why Dr. Vickey Huger decreased her Ambien and spoke with Dr. Vickey Huger and Dr. Vickey Huger stated base on last OV with the doctor on 11/09/13 that the doctor denied her request to continue Ambien 10 mg and Dr. Vickey Huger prescribed her Ambien 5 mg to be taken PRN. I advised the pt that if she has any other problems, questions or concerns to call the office. Pt verbalized understanding.

## 2014-01-04 NOTE — Telephone Encounter (Signed)
Increased tramadol to 100mg  BID but counseled patient that she needs to limit use to no more than 2-3 times per week as she likely has a component of medication overuse headache. Long term will aim to taper off this medication. Started keppra 500mg  BID for migraine ppx. Will continue with Botox injections. If no improvement will refer to headache clinic.

## 2014-01-04 NOTE — Telephone Encounter (Signed)
Please refer to next message regarding multiple providers.

## 2014-01-04 NOTE — Telephone Encounter (Signed)
Patient called me asking for refill. Please let her know based on this I will not be refilling the tramadol or ambien. Thanks.

## 2014-01-04 NOTE — Telephone Encounter (Signed)
I called back.  Spoke with Raynelle Fanning.  She said the patient is not only getting Ambien and Tramadol from our practice, she is also getting it from another practice and another pharmacy.  She has gotten the following Rx's: Date:   Drug:   Quantity: Prescriber:  Pharmacy: 12/29/2013 Tramadol 50mg  60  [facility] [employer]  12/22/2013 Tramadol 50mg  60  Elspeth Cho  [facility] 12/15/2013 Tramadol 50mg  [address] [employer]  12/10/2013 Tramadol 50mg   60  Lynn Lam  [facility] 11/30/2013 Tramadol 50mg  60  [facility] [place] 11/24/2013 Tramadol 50mg  60  [facility] Wh[facility] [place] 11/13/2013 Tramadol 50mg  60  Carmen Dohmeier [facility] 12/29/2013 Zolpidem 10mg  30  [facility] [employer] 12/13/2013 Zolpidem 10mg  30  [facility] [employer] 12/07/2013 Zolpidem 10mg   25  Carmen Dohmeier [facility] 11/24/2013 Zolpidem 10mg  30  [address] [employer] 11/09/2013 Zolpidem 10mg   25  Carmen Dohmeier [facility]  Forwarding this info to the providers so they are aware.

## 2014-01-04 NOTE — Telephone Encounter (Signed)
Patient calling to update Dr. Hosie Poisson that the Botox helped a little bit but did not make the headaches go completely away. She would like to continue with the Botox  She also states the tramadol does not really work for her as a pain med. She is completely out of it and would like to try a different medication. Please call to advise.

## 2014-01-06 ENCOUNTER — Telehealth: Payer: Self-pay | Admitting: Neurology

## 2014-01-06 ENCOUNTER — Other Ambulatory Visit: Payer: Self-pay | Admitting: Neurology

## 2014-01-06 DIAGNOSIS — G8929 Other chronic pain: Secondary | ICD-10-CM

## 2014-01-06 MED ORDER — TRAMADOL HCL 50 MG PO TABS: ORAL_TABLET | ORAL | Status: AC

## 2014-01-06 NOTE — Telephone Encounter (Signed)
Message sent to Shanda Bumps, to see if the prescription has already been faxed to the pharmacy.

## 2014-01-06 NOTE — Telephone Encounter (Signed)
Spoke with patient and shared Dr Minus Breeding note below, she verbalized understanding

## 2014-01-06 NOTE — Telephone Encounter (Signed)
Patient called and stated Pharmacy hadn't received Rx for traMADol (ULTRAM) 50 MG tablet.

## 2014-01-06 NOTE — Telephone Encounter (Signed)
Patient called and stated she felt like she was Detoxing and questioning what to do?

## 2014-01-06 NOTE — Telephone Encounter (Signed)
Please let her know a prescription was sent in and she should follow the schedule below:  Week 1: one tablet (50mg ) twice a day Week 2 and 3: 1/2 tablet (25mg ) twice a day Week 4: 1/2 tablet once daily and then stop  She will not get further refills from [place]. I have sent a referral to a pain clinic, she should follow up with them for further management of her pain.

## 2014-01-06 NOTE — Telephone Encounter (Addendum)
Spoke with patient and she is real shaky,sweaty, diarrhea, nausea since taking last dosage of tramadol-50mg .  Symptoms are really bad today and was wanting to know if she could be wheened off the medication

## 2014-01-06 NOTE — Telephone Encounter (Signed)
Rx has been faxed to the pharmacy.  I called the patient back.  Got no answer.  Left message.

## 2014-02-13 ENCOUNTER — Telehealth: Payer: Self-pay | Admitting: Neurology

## 2014-02-13 NOTE — Telephone Encounter (Signed)
Pharmacist wanted to know if it was ok to fill the Ambien prescription that had refills on it.  Per Dr. Vickey Hugerohmeier the Ambien is fine to refill.  The pharmacist, Raynelle FanningJulie was notified.

## 2014-02-13 NOTE — Telephone Encounter (Signed)
Pharmacist Raynelle FanningJulie would like to speak with Dr. Oliva Bustardohmeier's nurse. Please call.

## 2014-03-01 ENCOUNTER — Other Ambulatory Visit: Payer: Self-pay | Admitting: Neurology

## 2014-03-02 NOTE — Telephone Encounter (Signed)
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  Referral to a headache specialist is recommended.  [facility] functions as her PCP>  Melvyn Novasarmen Dohmeier , MD  11/09/2013, 12:47 PM

## 2014-03-03 ENCOUNTER — Telehealth: Payer: Self-pay

## 2014-03-03 MED ORDER — IMIPRAMINE HCL 50 MG PO TABS: 50.0000 mg | ORAL_TABLET | Freq: Every day | ORAL | Status: DC

## 2014-03-03 NOTE — Telephone Encounter (Signed)
That is fine. Please just confirm she is not also taking Elavil or any other antidepressants. Thanks.

## 2014-03-03 NOTE — Telephone Encounter (Signed)
I called the pharmacy to ensure she is not taking Elavil or any other antidepressants.  Spoke with pharmacist, Selena BattenKim.  She went through the patient's history and verified she is not taking any anti-depressants or Elavil.  I called the patient back, but got no answer.  Left message Rx has been sent.

## 2014-03-03 NOTE — Telephone Encounter (Signed)
Patient came into the office requesting a new Rx for Imipramine.  She was previously on this medication, but it was d/c in the past because she said it was not effective.  She would now like to try it again.  Please advise.  Thank you.

## 2014-04-11 ENCOUNTER — Other Ambulatory Visit: Payer: Self-pay | Admitting: Neurology

## 2014-04-11 NOTE — Telephone Encounter (Signed)
Please refer to note from 05/27

## 2014-06-12 ENCOUNTER — Telehealth: Payer: Self-pay | Admitting: Neurology

## 2014-06-12 NOTE — Telephone Encounter (Signed)
Patient states she is former Dr. Hosie PoissonSumner patient, requesting refill of Tramadol, please call when ready for pick up.

## 2014-06-12 NOTE — Telephone Encounter (Signed)
Per note on 05/27, Dr Hosie PoissonSumner said we would not be refilling this med due to it being prescribed by multiple providers.  I called the patient back.  Got no answer.  Left message.

## 2014-06-12 NOTE — Telephone Encounter (Signed)
Dr Vickey Hugerohmeier also agreed with no refills per note on 05/27.

## 2014-07-31 ENCOUNTER — Other Ambulatory Visit: Payer: Self-pay | Admitting: Neurology

## 2014-08-27 ENCOUNTER — Other Ambulatory Visit: Payer: Self-pay | Admitting: Neurology

## 2015-01-11 ENCOUNTER — Other Ambulatory Visit: Payer: Self-pay | Admitting: Neurology

## 2015-06-07 NOTE — Telephone Encounter (Signed)
Error

## 2017-10-02 ENCOUNTER — Encounter (HOSPITAL_COMMUNITY): Payer: Self-pay

## 2017-10-02 ENCOUNTER — Emergency Department (HOSPITAL_COMMUNITY): Payer: BLUE CROSS/BLUE SHIELD

## 2017-10-02 ENCOUNTER — Other Ambulatory Visit: Payer: Self-pay

## 2017-10-02 DIAGNOSIS — I10 Essential (primary) hypertension: Secondary | ICD-10-CM | POA: Insufficient documentation

## 2017-10-02 DIAGNOSIS — Z7984 Long term (current) use of oral hypoglycemic drugs: Secondary | ICD-10-CM | POA: Diagnosis not present

## 2017-10-02 DIAGNOSIS — Z79899 Other long term (current) drug therapy: Secondary | ICD-10-CM | POA: Diagnosis not present

## 2017-10-02 DIAGNOSIS — R42 Dizziness and giddiness: Secondary | ICD-10-CM | POA: Insufficient documentation

## 2017-10-02 DIAGNOSIS — E119 Type 2 diabetes mellitus without complications: Secondary | ICD-10-CM | POA: Insufficient documentation

## 2017-10-02 LAB — COMPREHENSIVE METABOLIC PANEL
ALBUMIN: 4.3 g/dL (ref 3.5–5.0)
ALT: 18 U/L (ref 14–54)
AST: 20 U/L (ref 15–41)
Alkaline Phosphatase: 66 U/L (ref 38–126)
Anion gap: 15 (ref 5–15)
BUN: 11 mg/dL (ref 6–20)
CHLORIDE: 104 mmol/L (ref 101–111)
CO2: 21 mmol/L — AB (ref 22–32)
Calcium: 9.2 mg/dL (ref 8.9–10.3)
Creatinine, Ser: 0.84 mg/dL (ref 0.44–1.00)
GFR calc Af Amer: >60 mL/min (ref >60)
GFR calc non Af Amer: >60 mL/min (ref >60)
Glucose, Bld: 250 mg/dL — ABNORMAL HIGH (ref 65–99)
Potassium: 4 mmol/L (ref 3.5–5.1)
SODIUM: 140 mmol/L (ref 135–145)
Total Bilirubin: 0.4 mg/dL (ref 0.3–1.2)
Total Protein: 6.7 g/dL (ref 6.5–8.1)

## 2017-10-02 LAB — I-STAT CHEM 8, ED
BUN: 13 mg/dL (ref 6–20)
CHLORIDE: 103 mmol/L (ref 101–111)
Calcium, Ion: 1.05 mmol/L — ABNORMAL LOW (ref 1.15–1.40)
Creatinine, Ser: 0.8 mg/dL (ref 0.44–1.00)
Glucose, Bld: 245 mg/dL — ABNORMAL HIGH (ref 65–99)
HCT: 39 % (ref 36.0–46.0)
Hemoglobin: 13.3 g/dL (ref 12.0–15.0)
POTASSIUM: 4 mmol/L (ref 3.5–5.1)
Sodium: 139 mmol/L (ref 135–145)
TCO2: 24 mmol/L (ref 22–32)

## 2017-10-02 LAB — CBC
HCT: 40.1 % (ref 36.0–46.0)
Hemoglobin: 13.4 g/dL (ref 12.0–15.0)
MCH: 30.6 pg (ref 26.0–34.0)
MCHC: 33.4 g/dL (ref 30.0–36.0)
MCV: 91.6 fL (ref 78.0–100.0)
PLATELETS: 233 10*3/uL (ref 150–400)
RBC: 4.38 MIL/uL (ref 3.87–5.11)
RDW: 12.7 % (ref 11.5–15.5)
WBC: 4.7 10*3/uL (ref 4.0–10.5)

## 2017-10-02 LAB — PROTIME-INR
INR: 0.97
PROTHROMBIN TIME: 12.8 s (ref 11.4–15.2)

## 2017-10-02 LAB — I-STAT BETA HCG BLOOD, ED (MC, WL, AP ONLY)

## 2017-10-02 LAB — DIFFERENTIAL
BASOS ABS: 0 10*3/uL (ref 0.0–0.1)
BASOS PCT: 1 %
Eosinophils Absolute: 0.2 10*3/uL (ref 0.0–0.7)
Eosinophils Relative: 4 %
Lymphocytes Relative: 33 %
Lymphs Abs: 1.6 10*3/uL (ref 0.7–4.0)
Monocytes Absolute: 0.3 10*3/uL (ref 0.1–1.0)
Monocytes Relative: 6 %
Neutro Abs: 2.7 10*3/uL (ref 1.7–7.7)
Neutrophils Relative %: 56 %

## 2017-10-02 LAB — I-STAT TROPONIN, ED: Troponin i, poc: 0 ng/mL (ref 0.00–0.08)

## 2017-10-02 LAB — APTT: APTT: 23 s — AB (ref 24–36)

## 2017-10-02 LAB — CBG MONITORING, ED: Glucose-Capillary: 233 mg/dL — ABNORMAL HIGH (ref 65–99)

## 2017-10-02 MED ORDER — ONDANSETRON 4 MG PO TBDP: 4.0000 mg | ORAL_TABLET | Freq: Once | ORAL | Status: DC

## 2017-10-02 NOTE — ED Provider Notes (Signed)
Patient placed in Quick Look pathway, seen and evaluated   Chief Complaint: Dizziness  HPI:   Patient reports approximate 6:00 this afternoon while she was eating a sandwich she developed acute onset dizziness.  Reports that as room spinning sensation and lightheadedness.  She reports full body numbness.  Denies any associated weakness or vision changes.  Reports some mild disorientation.  Patient reports being a diabetic.  Denies any associated chest pain, shortness of breath, syncope, vision changes.  Does report some mild nausea with one episode of nonbloody bilious emesis.  ROS: Dizziness, nausea, lightheadedness, vomiting (one)  Physical Exam:   Gen: No distress  Neuro: Awake and Alert  Skin: Warm    Focused Exam: The patient is alert, attentive, and oriented x 3. Speech is clear. Cranial nerve II-VII grossly intact. Negative pronator drift. Sensation intact. Strength 5/5 in all extremities. Reflexes 2+ and symmetric at biceps, triceps, knees, and ankles. Rapid alternating movement and fine finger movements intact. Romberg is absent. Posture and gait normal.  Heart regular rate and rhythm.  Lungs clear to auscultation bilaterally.  No nuchal rigidity.  No focal abdominal tenderness.  Pulses equal in all extremities.    Initiation of care has begun. The patient has been counseled on the process, plan, and necessity for staying for the completion/evaluation, and the remainder of the medical screening examination   Discussed with the patient that exiting the department prior to completion of the work-up is AMA and there is no guarantee that there are no emergency medical conditions present.     Rise MuLeaphart, Malakai Schoenherr T, PA-C 10/02/17 2032    Tegeler, Canary Brimhristopher J, MD 10/02/17 [phone]

## 2017-10-02 NOTE — ED Notes (Signed)
Tyler in room examining pt.

## 2017-10-02 NOTE — ED Triage Notes (Signed)
Pt here for dizziness onset 2 hours ago while eating a sandwhich, no focal weakness, repports she was eating, became dizzy, emesis and numb feeling over with some disorientation. Pt answering questions appropriately but reports still is disoriented, sts she is diabetic and checked her blood sugar and it was 178.

## 2017-10-03 ENCOUNTER — Emergency Department (HOSPITAL_COMMUNITY)
Admission: EM | Admit: 2017-10-03 | Discharge: 2017-10-03 | Disposition: A | Discharge status: Home / Self Care | Payer: BLUE CROSS/BLUE SHIELD | Attending: Emergency Medicine | Admitting: Emergency Medicine

## 2017-10-03 DIAGNOSIS — R42 Dizziness and giddiness: Secondary | ICD-10-CM

## 2017-10-03 MED ORDER — TRAMADOL HCL 50 MG PO TABS
50.0000 mg | ORAL_TABLET | Freq: Once | ORAL | Status: AC
  Administered 2017-10-03: 50 mg via ORAL
  Filled 2017-10-03: qty 1

## 2017-10-03 NOTE — Discharge Instructions (Signed)
Drink plenty of fluids and get plenty of rest.  Follow-up with your primary doctor in the next 1-2 weeks, and return to the ER if symptoms significantly worsen or change.

## 2017-10-03 NOTE — ED Provider Notes (Signed)
[facility] EMERGENCY DEPARTMENT Provider Note   CSN: [id number] Arrival date & time: 10/02/17  1944     History   Chief Complaint Chief Complaint  Patient presents with  . Dizziness    HPI Bridget [name] is a 47 y.o. female.  Patient is a 47 year old female with history of diabetes, hypertension, anemia, and insomnia.  She presents today for evaluation of dizziness.  She reports she was at home this evening eating a sandwich when she began to feel dizzy and what she describes as "numb all over".  This lasted for several minutes, then she called her family member and was brought here.  Over the next hour, her symptoms have since resolved.  She denies any recent illness.  She denies any palpitation, seizure activity, loss of consciousness, or bowel or bladder incontinence.   The history is provided by the patient.  Dizziness  Quality:  Imbalance Severity:  Moderate Onset quality:  Sudden Timing:  Constant Progression:  Resolved Chronicity:  New Relieved by:  Nothing Worsened by:  Nothing Ineffective treatments:  None tried   Past Medical History:  Diagnosis Date  . Anemia   . Diabetes mellitus without complication (HCC)   . Hyperlipidemia   . Hypertension   . Insomnia   . 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 Nephrolithiasis   . 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     Patient Active Problem List   Diagnosis Date Noted  . Migraine, unspecified, without mention of intractable migraine without mention of status migrainosus 11/09/2013  . Worsening headaches 04/27/2013  . 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 CESAREAN SECTION  2000  . endometreosis  07/10/2004  . LAPAROSCOPY     therapeutic  . MOUTH SURGERY  1987   wisdom teeth    OB History    No data  available       Home Medications    Prior to Admission medications   Medication Sig Start Date End Date Taking? Authorizing Provider  aspirin 81 MG tablet Take 81 mg by mouth daily.    [provider]  botulinum toxin Type A (BOTOX) 100 UNITS SOLR injection Inject 100 Units into the muscle once. 11/09/13   Dohmeier, Porfirio Mylar, MD  Canagliflozin [facility]) 100 MG TABS Take by mouth daily.    [provider]  chloridazePOXIDE-amitriptyline (LIMBITROL) 5-12.5 MG per tablet  11/09/13   [provider]  glimepiride (AMARYL) 4 MG tablet Take 4 mg by mouth 2 (two) times daily.    [provider]  glucose blood test strip 1 each by Other route as needed for other. Ultra blue strip,Use as instructed    [provider]  imipramine (TOFRANIL) 50 MG tablet TAKE 1 TABLET EVERY DAY 07/31/14   Dohmeier, Porfirio Mylar, MD  imipramine (TOFRANIL) 50 MG tablet TAKE 1 TABLET EVERY DAY 08/27/14   Dohmeier, Porfirio Mylar, MD  levETIRAcetam (KEPPRA) 500 MG tablet Take 1 tablet (500 mg total) by mouth 2 (two) times daily. 01/04/14   Ramond Marrow, DO  LOSARTAN POTASSIUM PO Take 100 mg by mouth daily.    [provider]  metFORMIN (GLUCOPHAGE) 500 MG tablet Take 500 mg by mouth 2 (two) times daily with a meal.    [provider]  norgestimate-ethinyl estradiol (SPRINTEC 28)  0.25-35 MG-MCG tablet Take 1 tablet by mouth daily.    [provider]  pantoprazole (PROTONIX) 40 MG tablet  11/09/13   [provider]  predniSONE (DELTASONE) 5 MG tablet Take 1 tablet (5 mg total) by mouth daily with breakfast. 11/09/13   Dohmeier, Porfirio Mylararmen, MD  traMADol (ULTRAM) 50 MG tablet 50mg  tablets, take 1 bid for 7 days, then 1/2 tab bid for 14 days, then 1/2 tab daily for 7 days then stop 01/06/14   Ramond MarrowSumner, Peter J, DO  zolpidem (AMBIEN) 10 MG tablet Take 0.5 tablets (5 mg total) by mouth at bedtime as needed for sleep (do not exceed 5 out of 7 nights per week). 12/23/13   Dohmeier,  Porfirio Mylararmen, MD    Family History Family History  Problem Relation Age of Onset  . Alzheimer's disease Paternal Grandmother     Social History Social History   Tobacco Use  . Smoking status: Never Smoker  . Smokeless tobacco: Never Used  Substance Use Topics  . Alcohol use: No  . Drug use: No     Allergies   Ibuprofen; Naprosyn [naproxen]; and Simvastatin   Review of Systems Review of Systems  Neurological: Positive for dizziness.  All other systems reviewed and are negative.    Physical Exam Updated Vital Signs BP 129/76 (BP Location: Right Arm)   Pulse 72   Temp (!) 97.5 F (36.4 C) (Oral)   Resp 16   SpO2 99%   Physical Exam  Constitutional: She is oriented to person, place, and time. She appears well-developed and well-nourished. No distress.  HENT:  Head: Normocephalic and atraumatic.  Eyes: EOM are normal. Pupils are equal, round, and reactive to light.  Neck: Normal range of motion. Neck supple.  Cardiovascular: Normal rate and regular rhythm. Exam reveals no gallop and no friction rub.  No murmur heard. Pulmonary/Chest: Effort normal and breath sounds normal. No respiratory distress. She has no wheezes.  Abdominal: Soft. Bowel sounds are normal. She exhibits no distension. There is no tenderness.  Musculoskeletal: Normal range of motion.  Neurological: She is alert and oriented to person, place, and time. No cranial nerve deficit. She exhibits normal muscle tone. Coordination normal.  Skin: Skin is warm and dry. She is not diaphoretic.  Nursing note and vitals reviewed.    ED Treatments / Results  Labs (all labs ordered are listed, but only abnormal results are displayed) Labs Reviewed  APTT - Abnormal; Notable for the following components:      Result Value   aPTT 23 (*)    All other components within normal limits  COMPREHENSIVE METABOLIC PANEL - Abnormal; Notable for the following components:   CO2 21 (*)    Glucose, Bld 250 (*)    All other  components within normal limits  CBG MONITORING, ED - Abnormal; Notable for the following components:   Glucose-Capillary 233 (*)    All other components within normal limits  I-STAT CHEM 8, ED - Abnormal; Notable for the following components:   Glucose, Bld 245 (*)    Calcium, Ion 1.05 (*)    All other components within normal limits  PROTIME-INR  CBC  DIFFERENTIAL  I-STAT TROPONIN, ED  I-STAT BETA HCG BLOOD, ED ([facility], [facility], [facility] ONLY)  CBG MONITORING, ED    EKG  EKG Interpretation  Date/Time:  Friday October 02 2017 19:58:52 EST Ventricular Rate:  77 PR Interval:  132 QRS Duration: 92 QT Interval:  416 QTC Calculation: 470 R Axis:   80  Text Interpretation:  Normal sinus rhythm Incomplete right bundle branch block Borderline ECG Confirmed by Geoffery Lyons ([id number]) on 10/03/2017 4:40:57 AM       Radiology Ct Head Wo Contrast  Result Date: 10/02/2017 CLINICAL DATA:  Dizziness and numbness EXAM: CT HEAD WITHOUT CONTRAST TECHNIQUE: Contiguous axial images were obtained from the base of the skull through the vertex without intravenous contrast. COMPARISON:  Report from 01/28/2007 FINDINGS: Brain: No evidence of acute infarction, hemorrhage, hydrocephalus, extra-axial collection or mass lesion/mass effect. Vascular: No hyperdense vessel or unexpected calcification. Skull: Normal. Negative for fracture or focal lesion. Sinuses/Orbits: No acute finding. Other: Developmental incomplete posterior arch of C1, a normal variant. IMPRESSION: Normal head CT Electronically Signed   By: Tollie Eth M.D.   On: 10/02/2017 20:46    Procedures Procedures (including critical care time)  Medications Ordered in ED Medications  ondansetron (ZOFRAN-ODT) disintegrating tablet 4 mg (not administered)     Initial Impression / Assessment and Plan / ED Course  I have reviewed the triage vital signs and the nursing notes.  Pertinent labs & imaging results that were available during my care of the patient  were reviewed by me and considered in my medical decision making (see chart for details).  Patient presents here with complaints of dizziness and feeling numb that started abruptly earlier this evening.  Her symptoms lasted for approximately 1 hour, then resolved.  Her symptoms are diffuse and not unilateral and do not sound consistent with a stroke.  Her neurologic exam is nonfocal and workup was unremarkable including CT scan, laboratory studies, and EKG.  At this point, I feel as though she is appropriate for discharge.  She is to return as needed and follow-up with her primary doctor.  Final Clinical Impressions(s) / ED Diagnoses   Final diagnoses:  None    ED Discharge Orders    None       Geoffery Lyons, MD 10/03/17 [phone]

## 2021-06-06 ENCOUNTER — Emergency Department (HOSPITAL_BASED_OUTPATIENT_CLINIC_OR_DEPARTMENT_OTHER): Payer: BC Managed Care – PPO

## 2021-06-06 ENCOUNTER — Other Ambulatory Visit: Payer: Self-pay

## 2021-06-06 ENCOUNTER — Emergency Department (HOSPITAL_BASED_OUTPATIENT_CLINIC_OR_DEPARTMENT_OTHER): Payer: BC Managed Care – PPO | Admitting: Radiology

## 2021-06-06 ENCOUNTER — Emergency Department (HOSPITAL_BASED_OUTPATIENT_CLINIC_OR_DEPARTMENT_OTHER)
Admission: EM | Admit: 2021-06-06 | Discharge: 2021-06-06 | Disposition: A | Discharge status: Home / Self Care | Payer: BC Managed Care – PPO | Attending: Emergency Medicine | Admitting: Emergency Medicine

## 2021-06-06 ENCOUNTER — Encounter (HOSPITAL_BASED_OUTPATIENT_CLINIC_OR_DEPARTMENT_OTHER): Payer: Self-pay | Admitting: Obstetrics and Gynecology

## 2021-06-06 DIAGNOSIS — R0781 Pleurodynia: Secondary | ICD-10-CM | POA: Diagnosis not present

## 2021-06-06 DIAGNOSIS — Z79899 Other long term (current) drug therapy: Secondary | ICD-10-CM | POA: Insufficient documentation

## 2021-06-06 DIAGNOSIS — S161XXA Strain of muscle, fascia and tendon at neck level, initial encounter: Secondary | ICD-10-CM | POA: Diagnosis not present

## 2021-06-06 DIAGNOSIS — S39012A Strain of muscle, fascia and tendon of lower back, initial encounter: Secondary | ICD-10-CM | POA: Insufficient documentation

## 2021-06-06 DIAGNOSIS — Z7982 Long term (current) use of aspirin: Secondary | ICD-10-CM | POA: Insufficient documentation

## 2021-06-06 DIAGNOSIS — E1140 Type 2 diabetes mellitus with diabetic neuropathy, unspecified: Secondary | ICD-10-CM | POA: Insufficient documentation

## 2021-06-06 DIAGNOSIS — M542 Cervicalgia: Secondary | ICD-10-CM

## 2021-06-06 DIAGNOSIS — Z7984 Long term (current) use of oral hypoglycemic drugs: Secondary | ICD-10-CM | POA: Diagnosis not present

## 2021-06-06 DIAGNOSIS — Y92410 Unspecified street and highway as the place of occurrence of the external cause: Secondary | ICD-10-CM | POA: Diagnosis not present

## 2021-06-06 DIAGNOSIS — T148XXA Other injury of unspecified body region, initial encounter: Secondary | ICD-10-CM

## 2021-06-06 DIAGNOSIS — I10 Essential (primary) hypertension: Secondary | ICD-10-CM | POA: Insufficient documentation

## 2021-06-06 DIAGNOSIS — S34109A Unspecified injury to unspecified level of lumbar spinal cord, initial encounter: Secondary | ICD-10-CM | POA: Diagnosis present

## 2021-06-06 DIAGNOSIS — M545 Low back pain, unspecified: Secondary | ICD-10-CM

## 2021-06-06 MED ORDER — METHOCARBAMOL 500 MG PO TABS: 500.0000 mg | ORAL_TABLET | Freq: Every evening | ORAL | 0 refills | Status: AC | PRN

## 2021-06-06 NOTE — Discharge Instructions (Signed)
Take Tylenol up to 4 times a day as needed for pain.  You may supplement with ibuprofen, 3 times a day to help with pain control.  Use robaxin as needed for muscle stiffness or soreness.  Have caution, this may make you tired or groggy.  Do not drive or operate heavy machinery while taking this medicine. Use ice packs or heating pads if this helps control your pain. You will likely have continued muscle stiffness and soreness over the next couple days.  Follow-up with primary care in 1 week if your symptoms are not improving. Return to the emergency room if you develop vision changes, vomiting, slurred speech, numbness, loss of bowel or bladder control, or any new or worsening symptoms.
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
## 2022-11-10 DIAGNOSIS — I493 Ventricular premature depolarization: Secondary | ICD-10-CM | POA: Diagnosis not present

## 2022-11-10 DIAGNOSIS — G43709 Chronic migraine without aura, not intractable, without status migrainosus: Secondary | ICD-10-CM | POA: Diagnosis not present

## 2022-11-10 DIAGNOSIS — I10 Essential (primary) hypertension: Secondary | ICD-10-CM | POA: Diagnosis not present

## 2022-11-10 DIAGNOSIS — F5101 Primary insomnia: Secondary | ICD-10-CM | POA: Diagnosis not present

## 2022-11-10 DIAGNOSIS — Z8249 Family history of ischemic heart disease and other diseases of the circulatory system: Secondary | ICD-10-CM | POA: Diagnosis not present

## 2022-12-10 DIAGNOSIS — Z419 Encounter for procedure for purposes other than remedying health state, unspecified: Secondary | ICD-10-CM | POA: Diagnosis not present

## 2023-01-09 DIAGNOSIS — I493 Ventricular premature depolarization: Secondary | ICD-10-CM | POA: Diagnosis not present

## 2023-01-09 DIAGNOSIS — Z8249 Family history of ischemic heart disease and other diseases of the circulatory system: Secondary | ICD-10-CM | POA: Diagnosis not present

## 2023-01-09 DIAGNOSIS — R809 Proteinuria, unspecified: Secondary | ICD-10-CM | POA: Diagnosis not present

## 2023-01-09 DIAGNOSIS — E1129 Type 2 diabetes mellitus with other diabetic kidney complication: Secondary | ICD-10-CM | POA: Diagnosis not present

## 2023-01-09 DIAGNOSIS — I10 Essential (primary) hypertension: Secondary | ICD-10-CM | POA: Diagnosis not present

## 2023-01-09 DIAGNOSIS — E11319 Type 2 diabetes mellitus with unspecified diabetic retinopathy without macular edema: Secondary | ICD-10-CM | POA: Diagnosis not present

## 2023-01-09 DIAGNOSIS — Z794 Long term (current) use of insulin: Secondary | ICD-10-CM | POA: Diagnosis not present

## 2023-01-09 DIAGNOSIS — Z7984 Long term (current) use of oral hypoglycemic drugs: Secondary | ICD-10-CM | POA: Diagnosis not present

## 2023-01-10 DIAGNOSIS — Z419 Encounter for procedure for purposes other than remedying health state, unspecified: Secondary | ICD-10-CM | POA: Diagnosis not present

## 2023-01-15 DIAGNOSIS — I493 Ventricular premature depolarization: Secondary | ICD-10-CM | POA: Diagnosis not present

## 2023-01-15 DIAGNOSIS — I10 Essential (primary) hypertension: Secondary | ICD-10-CM | POA: Diagnosis not present

## 2023-01-15 DIAGNOSIS — Z8249 Family history of ischemic heart disease and other diseases of the circulatory system: Secondary | ICD-10-CM | POA: Diagnosis not present

## 2023-02-01 ENCOUNTER — Encounter (HOSPITAL_COMMUNITY): Payer: Self-pay

## 2023-02-01 ENCOUNTER — Other Ambulatory Visit: Payer: Self-pay

## 2023-02-01 ENCOUNTER — Emergency Department (HOSPITAL_COMMUNITY)
Admission: EM | Admit: 2023-02-01 | Discharge: 2023-02-02 | Disposition: A | Discharge status: Home / Self Care | Payer: Medicaid Other | Attending: Emergency Medicine | Admitting: Emergency Medicine

## 2023-02-01 ENCOUNTER — Emergency Department (HOSPITAL_COMMUNITY): Payer: Medicaid Other

## 2023-02-01 DIAGNOSIS — I10 Essential (primary) hypertension: Secondary | ICD-10-CM

## 2023-02-01 DIAGNOSIS — E119 Type 2 diabetes mellitus without complications: Secondary | ICD-10-CM | POA: Insufficient documentation

## 2023-02-01 DIAGNOSIS — Z79899 Other long term (current) drug therapy: Secondary | ICD-10-CM | POA: Diagnosis not present

## 2023-02-01 DIAGNOSIS — Z7982 Long term (current) use of aspirin: Secondary | ICD-10-CM | POA: Insufficient documentation

## 2023-02-01 DIAGNOSIS — Z7984 Long term (current) use of oral hypoglycemic drugs: Secondary | ICD-10-CM | POA: Insufficient documentation

## 2023-02-01 DIAGNOSIS — R079 Chest pain, unspecified: Secondary | ICD-10-CM | POA: Diagnosis not present

## 2023-02-01 DIAGNOSIS — R0789 Other chest pain: Secondary | ICD-10-CM | POA: Diagnosis not present

## 2023-02-01 LAB — BASIC METABOLIC PANEL
Anion gap: 15 (ref 5–15)
BUN: 9 mg/dL (ref 6–20)
CO2: 23 mmol/L (ref 22–32)
Calcium: 9.1 mg/dL (ref 8.9–10.3)
Chloride: 102 mmol/L (ref 98–111)
Creatinine, Ser: 0.72 mg/dL (ref 0.44–1.00)
GFR, Estimated: >60 mL/min (ref >60)
Glucose, Bld: 279 mg/dL — ABNORMAL HIGH (ref 70–99)
Potassium: 3.6 mmol/L (ref 3.5–5.1)
Sodium: 140 mmol/L (ref 135–145)

## 2023-02-01 LAB — CBC
HCT: 35.6 % — ABNORMAL LOW (ref 36.0–46.0)
Hemoglobin: 11.8 g/dL — ABNORMAL LOW (ref 12.0–15.0)
MCH: 28.5 pg (ref 26.0–34.0)
MCHC: 33.1 g/dL (ref 30.0–36.0)
MCV: 86 fL (ref 80.0–100.0)
Platelets: 244 10*3/uL (ref 150–400)
RBC: 4.14 MIL/uL (ref 3.87–5.11)
RDW: 12.6 % (ref 11.5–15.5)
WBC: 6.8 10*3/uL (ref 4.0–10.5)
nRBC: 0 % (ref 0.0–0.2)

## 2023-02-01 LAB — I-STAT BETA HCG BLOOD, ED (MC, WL, AP ONLY): I-stat hCG, quantitative: <5 m[IU]/mL (ref <5)

## 2023-02-01 LAB — TROPONIN I (HIGH SENSITIVITY): Troponin I (High Sensitivity): 3 ng/L (ref <18)

## 2023-02-01 NOTE — ED Triage Notes (Signed)
Pt reports she had an injection of Amovig  3 days ago and since then her BP has been elevated (SBP in the 200s). Pt reports some associated chest pain, nausea and anxiety.

## 2023-02-02 LAB — TSH: TSH: 2.463 u[IU]/mL (ref 0.350–4.500)

## 2023-02-02 LAB — TROPONIN I (HIGH SENSITIVITY): Troponin I (High Sensitivity): 4 ng/L (ref <18)

## 2023-02-02 LAB — I-STAT BETA HCG BLOOD, ED (MC, WL, AP ONLY): I-stat hCG, quantitative: <5 m[IU]/mL (ref <5)

## 2023-02-02 LAB — T4, FREE: Free T4: 0.87 ng/dL (ref 0.61–1.12)

## 2023-02-02 MED ORDER — HYDROCHLOROTHIAZIDE 25 MG PO TABS: 25.0000 mg | ORAL_TABLET | Freq: Every day | ORAL | 0 refills | Status: AC

## 2023-02-02 NOTE — ED Provider Notes (Signed)
[name] AT [facility] Provider Note   CSN: [id number] Arrival date & time: 02/01/23  2034     History  Chief Complaint  Patient presents with   Hypertension   Nausea   Chest Pain    Bridget [name] is a 52 y.o. female.  52 year old female here for hypertension, headache, palpitations and nausea.  Patient states she has a history of hypertension.  She recently increased her losartan for it.  She states that she still feels her blood pressure gets high and she will have some palpitations with this.  He also has a history of migraines and she has had these for 20 years.  She got a dose of Aimovig recently and knows that when the side effects of that could be high blood pressure.  She also has diabetes and has had higher blood sugars than normal recently.  She is under some stress related to ongoing divorce proceedings.  Got blood pressure cuff from a store today.  Not sure what her everyday blood pressures are.  She has seen a cardiologist.  Denies any shortness of breath, severe chest pain, severe headache worse than her normal, neurologic changes, changes in urine, changes in leg swelling.  Does take Excedrin for headaches.  She is at stable amount of other caffeine intake.  No consistent alcohol or drugs.  No trauma.   Hypertension Associated symptoms include chest pain.  Chest Pain      Home Medications Prior to Admission medications   Medication Sig Start Date End Date Taking? Authorizing Provider  hydrochlorothiazide (HYDRODIURIL) 25 MG tablet Take 1 tablet (25 mg total) by mouth daily. 02/02/23  Yes Grayling Schranz, Barbara Cower, MD  aspirin 81 MG tablet Take 81 mg by mouth daily.    [provider]  botulinum toxin Type A (BOTOX) 100 UNITS SOLR injection Inject 100 Units into the muscle once. 11/09/13   Dohmeier, Porfirio Mylar, MD  Canagliflozin [facility]) 100 MG TABS Take by mouth daily.    [provider]  chloridazePOXIDE-amitriptyline (LIMBITROL)  5-12.5 MG per tablet  11/09/13   [provider]  glimepiride (AMARYL) 4 MG tablet Take 4 mg by mouth 2 (two) times daily.    [provider]  glucose blood test strip 1 each by Other route as needed for other. Ultra blue strip,Use as instructed    [provider]  imipramine (TOFRANIL) 50 MG tablet TAKE 1 TABLET EVERY DAY 07/31/14   Dohmeier, Porfirio Mylar, MD  imipramine (TOFRANIL) 50 MG tablet TAKE 1 TABLET EVERY DAY 08/27/14   Dohmeier, Porfirio Mylar, MD  levETIRAcetam (KEPPRA) 500 MG tablet Take 1 tablet (500 mg total) by mouth 2 (two) times daily. 01/04/14   Ramond Marrow, DO  LOSARTAN POTASSIUM PO Take 100 mg by mouth daily.    [provider]  metFORMIN (GLUCOPHAGE) 500 MG tablet Take 500 mg by mouth 2 (two) times daily with a meal.    [provider]  methocarbamol (ROBAXIN) 500 MG tablet Take 1 tablet (500 mg total) by mouth at bedtime as needed for muscle spasms. 06/06/21   Caccavale, Sophia, PA-C  norgestimate-ethinyl estradiol (SPRINTEC 28) 0.25-35 MG-MCG tablet Take 1 tablet by mouth daily.    [provider]  pantoprazole (PROTONIX) 40 MG tablet  11/09/13   [provider]  predniSONE (DELTASONE) 5 MG tablet Take 1 tablet (5 mg total) by mouth daily with breakfast. 11/09/13   Dohmeier, Porfirio Mylar, MD  traMADol (ULTRAM) 50 MG tablet 50mg  tablets, take 1  bid for 7 days, then 1/2 tab bid for 14 days, then 1/2 tab daily for 7 days then stop 01/06/14   Ramond Marrow, DO  zolpidem (AMBIEN) 10 MG tablet Take 0.5 tablets (5 mg total) by mouth at bedtime as needed for sleep (do not exceed 5 out of 7 nights per week). 12/23/13   Dohmeier, Porfirio Mylar, MD      Allergies    Ibuprofen, Naprosyn [naproxen], and Simvastatin    Review of Systems   Review of Systems  Cardiovascular:  Positive for chest pain.    Physical Exam Updated Vital Signs BP (!) 161/104   Pulse 88   Temp 98.8 F (37.1 C) (Oral)   Resp 18   SpO2 98%  Physical Exam Vitals and  nursing note reviewed.  Constitutional:      Appearance: She is well-developed.  HENT:     Head: Normocephalic and atraumatic.  Cardiovascular:     Rate and Rhythm: Normal rate and regular rhythm.  Pulmonary:     Effort: No respiratory distress.     Breath sounds: No stridor.  Abdominal:     General: There is no distension.     Palpations: Abdomen is soft.  Musculoskeletal:        General: Normal range of motion.     Cervical back: Normal range of motion.  Skin:    General: Skin is warm and dry.  Neurological:     Mental Status: She is alert.     ED Results / Procedures / Treatments   Labs (all labs ordered are listed, but only abnormal results are displayed) Labs Reviewed  BASIC METABOLIC PANEL - Abnormal; Notable for the following components:      Result Value   Glucose, Bld 279 (*)    All other components within normal limits  CBC - Abnormal; Notable for the following components:   Hemoglobin 11.8 (*)    HCT 35.6 (*)    All other components within normal limits  TSH  T4, FREE  I-STAT BETA HCG BLOOD, ED ([facility], [facility], [facility] ONLY)  TROPONIN I (HIGH SENSITIVITY)  TROPONIN I (HIGH SENSITIVITY)    EKG EKG Interpretation  Date/Time:  Sunday February 01 2023 20:31:28 EDT Ventricular Rate:  110 PR Interval:  134 QRS Duration: 86 QT Interval:  346 QTC Calculation: 468 R Axis:   83 Text Interpretation: Sinus tachycardia Nonspecific ST abnormality Abnormal ECG When compared with ECG of 02-Oct-2017 19:58, PREVIOUS ECG IS PRESENT Confirmed by Marily Memos [phone]) on 02/02/2023 12:16:40 AM  Radiology DG Chest 2 View  Result Date: 02/01/2023 CLINICAL DATA:  Chest pain, hypertension EXAM: CHEST - 2 VIEW COMPARISON:  06/06/2021 FINDINGS: Frontal and lateral views of the chest demonstrate an unremarkable cardiac silhouette. No airspace disease, effusion, or pneumothorax. No acute bony abnormalities. IMPRESSION: 1. No acute intrathoracic process. Electronically Signed   By: Sharlet Salina  M.D.   On: 02/01/2023 21:16    Procedures Procedures    Medications Ordered in ED Medications - No data to display  ED Course/ Medical Decision Making/ A&P                             Medical Decision Making Amount and/or Complexity of Data Reviewed Labs: ordered. Radiology: ordered.  Risk Prescription drug management.   52 year old female here with acute on chronic hypertension that seems to be somewhat symptomatic.  No evidence of endorgan damage related to it.  Will add  on a TSH.  Pending second troponin as well.  Will need to follow-up with cardiologist for the palpitations.  Will likely give her a prescription for an extra blood pressure medication if her blood pressures are consistently elevated over the next few days with a plan to reevaluate when that Aimovig wears off which could be over a month.  Workup reassuring. Still slightly hypertensive. Will start hydrochlorothiazide in 4-5 days if BP's consistently over 140 and fu w/ outpatient physicians for same.    Final Clinical Impression(s) / ED Diagnoses Final diagnoses:  Nonspecific chest pain  Hypertension, unspecified type    Rx / DC Orders ED Discharge Orders          Ordered    hydrochlorothiazide (HYDRODIURIL) 25 MG tablet  Daily        02/02/23 0214              Shiah Berhow, Barbara Cower, MD 02/02/23 4010

## 2023-02-02 NOTE — ED Notes (Signed)
Discharge instructions reviewed with patient. Patient questions answered and opportunity for education reviewed. Patient voices understanding of discharge instructions with no further questions. Patient ambulatory with steady gait to lobby.  

## 2023-02-09 DIAGNOSIS — Z419 Encounter for procedure for purposes other than remedying health state, unspecified: Secondary | ICD-10-CM | POA: Diagnosis not present

## 2023-03-12 DIAGNOSIS — Z419 Encounter for procedure for purposes other than remedying health state, unspecified: Secondary | ICD-10-CM | POA: Diagnosis not present

## 2023-04-12 DIAGNOSIS — Z419 Encounter for procedure for purposes other than remedying health state, unspecified: Secondary | ICD-10-CM | POA: Diagnosis not present

## 2023-04-17 DIAGNOSIS — Z8249 Family history of ischemic heart disease and other diseases of the circulatory system: Secondary | ICD-10-CM | POA: Diagnosis not present

## 2023-04-17 DIAGNOSIS — R0789 Other chest pain: Secondary | ICD-10-CM | POA: Diagnosis not present

## 2023-04-17 DIAGNOSIS — I10 Essential (primary) hypertension: Secondary | ICD-10-CM | POA: Diagnosis not present

## 2023-04-19 DIAGNOSIS — R0789 Other chest pain: Secondary | ICD-10-CM | POA: Diagnosis not present

## 2023-04-19 DIAGNOSIS — Z8249 Family history of ischemic heart disease and other diseases of the circulatory system: Secondary | ICD-10-CM | POA: Diagnosis not present

## 2023-04-19 DIAGNOSIS — I10 Essential (primary) hypertension: Secondary | ICD-10-CM | POA: Diagnosis not present

## 2023-05-12 DIAGNOSIS — Z419 Encounter for procedure for purposes other than remedying health state, unspecified: Secondary | ICD-10-CM | POA: Diagnosis not present

## 2023-06-12 DIAGNOSIS — Z419 Encounter for procedure for purposes other than remedying health state, unspecified: Secondary | ICD-10-CM | POA: Diagnosis not present

## 2023-07-07 DIAGNOSIS — F5101 Primary insomnia: Secondary | ICD-10-CM | POA: Diagnosis not present

## 2023-07-07 DIAGNOSIS — Z23 Encounter for immunization: Secondary | ICD-10-CM | POA: Diagnosis not present

## 2023-07-07 DIAGNOSIS — E11319 Type 2 diabetes mellitus with unspecified diabetic retinopathy without macular edema: Secondary | ICD-10-CM | POA: Diagnosis not present

## 2023-07-07 DIAGNOSIS — G43709 Chronic migraine without aura, not intractable, without status migrainosus: Secondary | ICD-10-CM | POA: Diagnosis not present

## 2023-07-07 DIAGNOSIS — I10 Essential (primary) hypertension: Secondary | ICD-10-CM | POA: Diagnosis not present

## 2023-07-07 DIAGNOSIS — E782 Mixed hyperlipidemia: Secondary | ICD-10-CM | POA: Diagnosis not present

## 2023-07-07 DIAGNOSIS — E039 Hypothyroidism, unspecified: Secondary | ICD-10-CM | POA: Diagnosis not present

## 2023-07-12 DIAGNOSIS — Z419 Encounter for procedure for purposes other than remedying health state, unspecified: Secondary | ICD-10-CM | POA: Diagnosis not present

## 2023-07-14 DIAGNOSIS — Z794 Long term (current) use of insulin: Secondary | ICD-10-CM | POA: Diagnosis not present

## 2023-07-14 DIAGNOSIS — E11319 Type 2 diabetes mellitus with unspecified diabetic retinopathy without macular edema: Secondary | ICD-10-CM | POA: Diagnosis not present

## 2023-07-14 DIAGNOSIS — E113299 Type 2 diabetes mellitus with mild nonproliferative diabetic retinopathy without macular edema, unspecified eye: Secondary | ICD-10-CM | POA: Diagnosis not present

## 2023-07-14 DIAGNOSIS — R809 Proteinuria, unspecified: Secondary | ICD-10-CM | POA: Diagnosis not present

## 2023-08-12 DIAGNOSIS — Z419 Encounter for procedure for purposes other than remedying health state, unspecified: Secondary | ICD-10-CM | POA: Diagnosis not present

## 2023-09-12 DIAGNOSIS — Z419 Encounter for procedure for purposes other than remedying health state, unspecified: Secondary | ICD-10-CM | POA: Diagnosis not present

## 2023-10-10 DIAGNOSIS — Z419 Encounter for procedure for purposes other than remedying health state, unspecified: Secondary | ICD-10-CM | POA: Diagnosis not present

## 2023-10-16 DIAGNOSIS — S61451A Open bite of right hand, initial encounter: Secondary | ICD-10-CM | POA: Diagnosis not present

## 2023-10-16 DIAGNOSIS — W540XXA Bitten by dog, initial encounter: Secondary | ICD-10-CM | POA: Diagnosis not present

## 2023-11-12 DIAGNOSIS — E11319 Type 2 diabetes mellitus with unspecified diabetic retinopathy without macular edema: Secondary | ICD-10-CM | POA: Diagnosis not present

## 2023-11-12 DIAGNOSIS — Z794 Long term (current) use of insulin: Secondary | ICD-10-CM | POA: Diagnosis not present

## 2023-11-12 DIAGNOSIS — R809 Proteinuria, unspecified: Secondary | ICD-10-CM | POA: Diagnosis not present

## 2023-11-21 DIAGNOSIS — Z419 Encounter for procedure for purposes other than remedying health state, unspecified: Secondary | ICD-10-CM | POA: Diagnosis not present

## 2023-11-26 DIAGNOSIS — E782 Mixed hyperlipidemia: Secondary | ICD-10-CM | POA: Diagnosis not present

## 2023-11-26 DIAGNOSIS — I10 Essential (primary) hypertension: Secondary | ICD-10-CM | POA: Diagnosis not present

## 2023-12-21 DIAGNOSIS — Z419 Encounter for procedure for purposes other than remedying health state, unspecified: Secondary | ICD-10-CM | POA: Diagnosis not present

## 2023-12-24 DIAGNOSIS — G894 Chronic pain syndrome: Secondary | ICD-10-CM | POA: Diagnosis not present

## 2023-12-24 DIAGNOSIS — E782 Mixed hyperlipidemia: Secondary | ICD-10-CM | POA: Diagnosis not present

## 2023-12-24 DIAGNOSIS — F5101 Primary insomnia: Secondary | ICD-10-CM | POA: Diagnosis not present

## 2024-01-21 DIAGNOSIS — Z419 Encounter for procedure for purposes other than remedying health state, unspecified: Secondary | ICD-10-CM | POA: Diagnosis not present

## 2024-01-28 DIAGNOSIS — D2239 Melanocytic nevi of other parts of face: Secondary | ICD-10-CM | POA: Diagnosis not present

## 2024-01-28 DIAGNOSIS — L720 Epidermal cyst: Secondary | ICD-10-CM | POA: Diagnosis not present

## 2024-02-20 DIAGNOSIS — Z419 Encounter for procedure for purposes other than remedying health state, unspecified: Secondary | ICD-10-CM | POA: Diagnosis not present

## 2024-03-22 DIAGNOSIS — Z419 Encounter for procedure for purposes other than remedying health state, unspecified: Secondary | ICD-10-CM | POA: Diagnosis not present

## 2024-04-04 DIAGNOSIS — Z794 Long term (current) use of insulin: Secondary | ICD-10-CM | POA: Diagnosis not present

## 2024-04-04 DIAGNOSIS — E11319 Type 2 diabetes mellitus with unspecified diabetic retinopathy without macular edema: Secondary | ICD-10-CM | POA: Diagnosis not present

## 2024-04-04 DIAGNOSIS — R809 Proteinuria, unspecified: Secondary | ICD-10-CM | POA: Diagnosis not present

## 2024-04-04 DIAGNOSIS — E113299 Type 2 diabetes mellitus with mild nonproliferative diabetic retinopathy without macular edema, unspecified eye: Secondary | ICD-10-CM | POA: Diagnosis not present

## 2024-04-10 DIAGNOSIS — F429 Obsessive-compulsive disorder, unspecified: Secondary | ICD-10-CM | POA: Diagnosis not present

## 2024-04-10 DIAGNOSIS — F419 Anxiety disorder, unspecified: Secondary | ICD-10-CM | POA: Diagnosis not present

## 2024-04-22 DIAGNOSIS — Z419 Encounter for procedure for purposes other than remedying health state, unspecified: Secondary | ICD-10-CM | POA: Diagnosis not present

## 2024-04-29 DIAGNOSIS — F419 Anxiety disorder, unspecified: Secondary | ICD-10-CM | POA: Diagnosis not present

## 2024-04-29 DIAGNOSIS — F429 Obsessive-compulsive disorder, unspecified: Secondary | ICD-10-CM | POA: Diagnosis not present

## 2024-05-16 DIAGNOSIS — I10 Essential (primary) hypertension: Secondary | ICD-10-CM | POA: Diagnosis not present

## 2024-07-19 DIAGNOSIS — E11319 Type 2 diabetes mellitus with unspecified diabetic retinopathy without macular edema: Secondary | ICD-10-CM | POA: Diagnosis not present

## 2024-07-19 DIAGNOSIS — E782 Mixed hyperlipidemia: Secondary | ICD-10-CM | POA: Diagnosis not present

## 2024-07-19 DIAGNOSIS — I10 Essential (primary) hypertension: Secondary | ICD-10-CM | POA: Diagnosis not present

## 2024-07-19 DIAGNOSIS — G43709 Chronic migraine without aura, not intractable, without status migrainosus: Secondary | ICD-10-CM | POA: Diagnosis not present

## 2024-07-19 DIAGNOSIS — F5101 Primary insomnia: Secondary | ICD-10-CM | POA: Diagnosis not present
